# Patient Record
Sex: Female | Born: 1950
Health system: Southern US, Community
[De-identification: ages and names within clinical notes are randomized; demographics above are authoritative.]

## PROBLEM LIST (undated history)

## (undated) ENCOUNTER — Emergency Department: Discharge status: Absent without leave | Payer: Medicare Other

## (undated) DIAGNOSIS — Z9889 Other specified postprocedural states: Secondary | ICD-10-CM

## (undated) DIAGNOSIS — E785 Hyperlipidemia, unspecified: Secondary | ICD-10-CM

## (undated) DIAGNOSIS — E079 Disorder of thyroid, unspecified: Secondary | ICD-10-CM

## (undated) DIAGNOSIS — M48061 Spinal stenosis, lumbar region without neurogenic claudication: Secondary | ICD-10-CM

## (undated) DIAGNOSIS — M1990 Unspecified osteoarthritis, unspecified site: Secondary | ICD-10-CM

## (undated) DIAGNOSIS — E119 Type 2 diabetes mellitus without complications: Secondary | ICD-10-CM

## (undated) DIAGNOSIS — F419 Anxiety disorder, unspecified: Secondary | ICD-10-CM

## (undated) DIAGNOSIS — R7303 Prediabetes: Secondary | ICD-10-CM

## (undated) DIAGNOSIS — K219 Gastro-esophageal reflux disease without esophagitis: Secondary | ICD-10-CM

## (undated) DIAGNOSIS — G4730 Sleep apnea, unspecified: Secondary | ICD-10-CM

## (undated) DIAGNOSIS — I10 Essential (primary) hypertension: Secondary | ICD-10-CM

## (undated) DIAGNOSIS — E559 Vitamin D deficiency, unspecified: Secondary | ICD-10-CM

## (undated) DIAGNOSIS — C801 Malignant (primary) neoplasm, unspecified: Secondary | ICD-10-CM

## (undated) DIAGNOSIS — M5431 Sciatica, right side: Secondary | ICD-10-CM

## (undated) DIAGNOSIS — T753XXA Motion sickness, initial encounter: Secondary | ICD-10-CM

## (undated) DIAGNOSIS — E039 Hypothyroidism, unspecified: Secondary | ICD-10-CM

## (undated) DIAGNOSIS — R112 Nausea with vomiting, unspecified: Secondary | ICD-10-CM

## (undated) HISTORY — PX: SPINE SURGERY: SHX786

## (undated) HISTORY — DX: Hyperlipidemia, unspecified: E78.5

## (undated) HISTORY — DX: Anxiety disorder, unspecified: F41.9

## (undated) HISTORY — DX: Essential (primary) hypertension: I10

## (undated) HISTORY — DX: Vitamin D deficiency, unspecified: E55.9

## (undated) HISTORY — DX: Disorder of thyroid, unspecified: E07.9

## (undated) HISTORY — DX: Gastro-esophageal reflux disease without esophagitis: K21.9

## (undated) HISTORY — DX: Type 2 diabetes mellitus without complications: E11.9

---

## 1979-01-12 HISTORY — PX: TUBAL LIGATION: SHX77

## 1999-08-06 ENCOUNTER — Encounter: Admission: RE | Admit: 1999-08-06 | Discharge: 1999-08-06 | Payer: Self-pay | Admitting: Family Medicine

## 1999-08-06 ENCOUNTER — Encounter: Payer: Self-pay | Admitting: Family Medicine

## 2000-08-29 ENCOUNTER — Encounter: Admission: RE | Admit: 2000-08-29 | Discharge: 2000-08-29 | Payer: Self-pay | Admitting: Family Medicine

## 2000-08-29 ENCOUNTER — Encounter: Payer: Self-pay | Admitting: Family Medicine

## 2001-01-20 ENCOUNTER — Encounter: Admission: RE | Admit: 2001-01-20 | Discharge: 2001-01-20 | Payer: Self-pay | Admitting: Family Medicine

## 2001-01-20 ENCOUNTER — Encounter: Payer: Self-pay | Admitting: Family Medicine

## 2001-02-09 ENCOUNTER — Other Ambulatory Visit: Admission: RE | Admit: 2001-02-09 | Discharge: 2001-02-09 | Payer: Self-pay | Admitting: Family Medicine

## 2001-08-30 ENCOUNTER — Encounter: Payer: Self-pay | Admitting: Family Medicine

## 2001-08-30 ENCOUNTER — Encounter: Admission: RE | Admit: 2001-08-30 | Discharge: 2001-08-30 | Payer: Self-pay | Admitting: Family Medicine

## 2002-02-21 ENCOUNTER — Other Ambulatory Visit: Admission: RE | Admit: 2002-02-21 | Discharge: 2002-02-21 | Payer: Self-pay | Admitting: Family Medicine

## 2003-01-12 HISTORY — PX: SPINE SURGERY: SHX786

## 2003-02-12 ENCOUNTER — Other Ambulatory Visit: Admission: RE | Admit: 2003-02-12 | Discharge: 2003-02-12 | Payer: Self-pay | Admitting: Family Medicine

## 2003-06-24 ENCOUNTER — Ambulatory Visit (HOSPITAL_COMMUNITY): Admission: RE | Admit: 2003-06-24 | Discharge: 2003-06-25 | Payer: Self-pay | Admitting: Neurosurgery

## 2003-08-01 ENCOUNTER — Encounter: Admission: RE | Admit: 2003-08-01 | Discharge: 2003-08-01 | Payer: Self-pay | Admitting: Neurosurgery

## 2003-10-22 ENCOUNTER — Encounter: Admission: RE | Admit: 2003-10-22 | Discharge: 2003-10-22 | Payer: Self-pay | Admitting: Neurosurgery

## 2003-10-28 ENCOUNTER — Ambulatory Visit: Payer: Self-pay | Admitting: Neurosurgery

## 2004-02-04 ENCOUNTER — Encounter: Admission: RE | Admit: 2004-02-04 | Discharge: 2004-02-04 | Payer: Self-pay | Admitting: Neurosurgery

## 2004-04-28 ENCOUNTER — Encounter: Admission: RE | Admit: 2004-04-28 | Discharge: 2004-04-28 | Payer: Self-pay | Admitting: Neurosurgery

## 2004-05-05 ENCOUNTER — Ambulatory Visit: Payer: Self-pay | Admitting: Family Medicine

## 2004-08-11 ENCOUNTER — Encounter: Admission: RE | Admit: 2004-08-11 | Discharge: 2004-08-11 | Payer: Self-pay | Admitting: Neurosurgery

## 2008-02-15 ENCOUNTER — Inpatient Hospital Stay: Payer: Self-pay | Admitting: Internal Medicine

## 2012-09-05 ENCOUNTER — Ambulatory Visit: Payer: Self-pay | Admitting: Family Medicine

## 2013-01-09 LAB — BASIC METABOLIC PANEL
BUN: 14 mg/dL (ref 4–21)
CREATININE: 0.9 mg/dL (ref 0.5–1.1)
Glucose: 101 mg/dL
POTASSIUM: 4.4 mmol/L (ref 3.4–5.3)
Sodium: 142 mmol/L (ref 137–147)

## 2013-01-10 LAB — HM PAP SMEAR: HM PAP: NORMAL

## 2013-09-06 ENCOUNTER — Ambulatory Visit: Payer: Self-pay | Admitting: General Practice

## 2013-09-11 LAB — HM MAMMOGRAPHY

## 2014-01-01 ENCOUNTER — Encounter: Payer: Self-pay | Admitting: Nurse Practitioner

## 2014-01-01 ENCOUNTER — Ambulatory Visit (INDEPENDENT_AMBULATORY_CARE_PROVIDER_SITE_OTHER): Payer: No Typology Code available for payment source | Admitting: Nurse Practitioner

## 2014-01-01 ENCOUNTER — Encounter (INDEPENDENT_AMBULATORY_CARE_PROVIDER_SITE_OTHER): Payer: Self-pay

## 2014-01-01 VITALS — BP 146/78 | HR 67 | Temp 98.0°F | Resp 12 | Ht 68.0 in | Wt 189.8 lb

## 2014-01-01 DIAGNOSIS — Z7689 Persons encountering health services in other specified circumstances: Secondary | ICD-10-CM

## 2014-01-01 DIAGNOSIS — Z7189 Other specified counseling: Secondary | ICD-10-CM

## 2014-01-01 DIAGNOSIS — R233 Spontaneous ecchymoses: Secondary | ICD-10-CM | POA: Insufficient documentation

## 2014-01-01 DIAGNOSIS — E785 Hyperlipidemia, unspecified: Secondary | ICD-10-CM

## 2014-01-01 DIAGNOSIS — Z0000 Encounter for general adult medical examination without abnormal findings: Secondary | ICD-10-CM | POA: Insufficient documentation

## 2014-01-01 DIAGNOSIS — F411 Generalized anxiety disorder: Secondary | ICD-10-CM

## 2014-01-01 MED ORDER — ALPRAZOLAM 0.25 MG PO TABS: 0.2500 mg | ORAL_TABLET | Freq: Two times a day (BID) | ORAL | Status: DC | PRN

## 2014-01-01 MED ORDER — PRAVASTATIN SODIUM 40 MG PO TABS: 40.0000 mg | ORAL_TABLET | Freq: Every day | ORAL | Status: DC

## 2014-01-01 NOTE — Patient Instructions (Addendum)
Happy Holidays!   Welcome to [facility]!

## 2014-01-01 NOTE — Progress Notes (Signed)
Pre visit review using our clinic review tool, if applicable. No additional management support is needed unless otherwise documented below in the visit note. 

## 2014-01-01 NOTE — Progress Notes (Signed)
Subjective:    Patient ID: Darlene Mcdowell, female    DOB: Jun 05, 1950, 63 y.o.   MRN: [id number]  HPI  Darlene Mcdowell is a 63 yo female here to establish care.   1) Health Maintenance-   Diet- Cooks at home, eats fast food, does not like raisins, cutting down on Beef  Exercise- Up and down stairs for work   Immunizations- last Dec. Pneumonia, tdap dec. 2014, flu 10/15, shingles vaccination in past also.   Mammogram- 09/15 Negative, dense breast tissue   Pap- 2014, normal  Bone Density- 3-4 years ago, normal  Colonoscopy- Never had, refuses    Suggested IFOBT  Eye Exam- Every year, Jan. 2016 next appointment  Dental Exam- Every 6 months  2) Chronic Problems-  Hyperlipidemia- Pravastatin 40 mg tablets   HTN- Not checking at home, Amlodipine 5 mg   Hypothyroid - Fall 2015, in normal range TSH  Urology- 3 years ago, stress incontinence    Gave some kind of pill and it wiped her out she reports.   3) Acute Problems-  Petechiae below knee both legs, denies wearing compression hose, knee high socks or other garments. Pt denies itching or other sensations. Onset greater than 1 year ago.   Review of Systems  Constitutional: Positive for fatigue. Negative for fever, chills and diaphoresis.  Eyes: Negative for visual disturbance.  Respiratory: Negative for cough, chest tightness, shortness of breath and wheezing.   Cardiovascular: Negative for chest pain, palpitations and leg swelling.  Gastrointestinal: Negative for nausea, vomiting and diarrhea.  Genitourinary:       Vaginal dryness and stress leakage  Musculoskeletal: Negative for myalgias, joint swelling and arthralgias.  Skin: Positive for rash.       Both legs below knees  Neurological: Negative for dizziness, numbness and headaches.  Psychiatric/Behavioral: Negative for suicidal ideas.       Anxiety, Denies depression   Past Medical History  Diagnosis Date  . Hypertension   . Hyperlipidemia   . Thyroid disease     under  active thyroid problems    History   Social History  . Marital Status: Married    Spouse Name: N/A    Number of Children: N/A  . Years of Education: N/A   Occupational History  . Not on file.   Social History Main Topics  . Smoking status: Former Smoker    Quit date: 01/12/2003  . Smokeless tobacco: Not on file  . Alcohol Use: 0.0 oz/week    0 Not specified per week     Comment: Occasional glass of wine  . Drug Use: No  . Sexual Activity:    Partners: Male     Comment: With husband   Other Topics Concern  . 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 Tubal ligation  1981  . Spine surgery  01/12/2003    cervical c-7    Family History  Problem Relation Age of Onset  . Stroke Mother   . Hypertension Mother   . Hyperlipidemia Father   . Heart disease Father   . Hypertension Father   .  Cancer Maternal Grandmother   . Heart disease Maternal Grandfather   . Heart disease Paternal Grandmother     No Known Allergies  No current outpatient prescriptions on file prior to visit.   No current facility-administered medications on file prior to visit.       Objective:   Physical Exam  Constitutional: She is oriented to person, place, and time. She appears well-developed and well-nourished. No distress.  HENT:  Head: Normocephalic and atraumatic.  Right Ear: External ear normal.  Left Ear: External ear normal.  Eyes: Conjunctivae and EOM are normal. Pupils are equal, round, and reactive to light.  Neck: Normal range of motion. Neck supple. No thyromegaly present.  Cardiovascular: Normal rate and regular rhythm.   Pulmonary/Chest: Effort normal and breath sounds  normal.  Abdominal: Soft. Bowel sounds are normal.  Neurological: She is oriented to person, place, and time. She exhibits normal muscle tone. Coordination normal.  Skin: Skin is warm and dry. Rash noted. She is not diaphoretic.  Petechiae below knees bilaterally.   Psychiatric: She has a normal mood and affect. Her behavior is normal. Judgment and thought content normal.    BP 146/78 mmHg  Pulse 67  Temp(Src) 98 F (36.7 C) (Oral)  Resp 12  Ht 5\' 8"  (1.727 m)  Wt 189 lb 12.8 oz (86.093 kg)  BMI 28.87 kg/m2  SpO2 97%  LMP 01/11/1994     Assessment & Plan:  Face to face time was 45 minutes and >50% spent counseling on above mentioned items.

## 2014-01-01 NOTE — Assessment & Plan Note (Signed)
New order for Xanax 0.25 mg twice daily for this month. Will fax to pharmacy and get controlled substance contract signed by June 2016.

## 2014-01-01 NOTE — Assessment & Plan Note (Signed)
Refill of Pravastatin 40 mg daily. Stable. Will obtain labs from former places.

## 2014-01-01 NOTE — Assessment & Plan Note (Signed)
Patient has unexplained petechiae below knees. No other symptoms. Obtain through [facility]: Cbc w/ diff w/ plt, AST and ALT, Sed rate, and PTT/PT/INR. FU in June if normal labs.

## 2014-01-09 LAB — PROTIME-INR: Protime: 10.7 seconds (ref 10.0–13.8)

## 2014-01-09 LAB — CBC AND DIFFERENTIAL
HCT: 41 % (ref 36–46)
Hemoglobin: 13.3 g/dL (ref 12.0–16.0)
NEUTROS ABS: 3 /uL
Platelets: 210 10*3/uL (ref 150–399)
WBC: 6 10^3/mL

## 2014-01-09 LAB — POCT INR: INR: 1 (ref 0.9–1.1)

## 2014-01-09 LAB — HEPATIC FUNCTION PANEL
ALT: 36 U/L — AB (ref 7–35)
AST: 23 U/L (ref 13–35)

## 2014-01-18 ENCOUNTER — Ambulatory Visit: Payer: Self-pay | Admitting: Nurse Practitioner

## 2014-02-13 ENCOUNTER — Other Ambulatory Visit: Payer: Self-pay | Admitting: Nurse Practitioner

## 2014-02-13 ENCOUNTER — Encounter: Payer: Self-pay | Admitting: Nurse Practitioner

## 2014-02-13 MED ORDER — ALPRAZOLAM 0.25 MG PO TABS: 0.2500 mg | ORAL_TABLET | Freq: Two times a day (BID) | ORAL | Status: DC | PRN

## 2014-03-06 ENCOUNTER — Other Ambulatory Visit: Payer: Self-pay | Admitting: Nurse Practitioner

## 2014-03-07 ENCOUNTER — Other Ambulatory Visit: Payer: Self-pay | Admitting: Nurse Practitioner

## 2014-03-07 MED ORDER — ALPRAZOLAM 0.25 MG PO TABS: 0.2500 mg | ORAL_TABLET | Freq: Two times a day (BID) | ORAL | Status: DC | PRN

## 2014-04-09 ENCOUNTER — Telehealth: Payer: Self-pay | Admitting: Nurse Practitioner

## 2014-04-11 MED ORDER — ALPRAZOLAM 0.25 MG PO TABS: 0.2500 mg | ORAL_TABLET | Freq: Two times a day (BID) | ORAL | Status: DC | PRN

## 2014-04-11 NOTE — Telephone Encounter (Signed)
Called patient and left message to pick up Rx.

## 2014-05-07 ENCOUNTER — Other Ambulatory Visit: Payer: Self-pay | Admitting: Nurse Practitioner

## 2014-05-07 NOTE — Telephone Encounter (Signed)
Last OV 12.22.15, last refill 3.31.16.  Please advise refill

## 2014-06-07 ENCOUNTER — Other Ambulatory Visit: Payer: Self-pay | Admitting: Nurse Practitioner

## 2014-06-08 NOTE — Telephone Encounter (Signed)
Okay to refill Xanax? Pt last seen on 01/01/14 & next appt on 07/09/14. Please advise

## 2014-06-11 NOTE — Telephone Encounter (Signed)
Rx faxed by [facility]

## 2014-07-03 ENCOUNTER — Ambulatory Visit: Payer: No Typology Code available for payment source | Admitting: Nurse Practitioner

## 2014-07-04 ENCOUNTER — Encounter: Payer: Self-pay | Admitting: *Deleted

## 2014-07-08 ENCOUNTER — Other Ambulatory Visit: Payer: Self-pay

## 2014-07-09 ENCOUNTER — Encounter: Payer: Self-pay | Admitting: Nurse Practitioner

## 2014-07-09 ENCOUNTER — Other Ambulatory Visit: Payer: Self-pay | Admitting: Nurse Practitioner

## 2014-07-09 ENCOUNTER — Ambulatory Visit (INDEPENDENT_AMBULATORY_CARE_PROVIDER_SITE_OTHER): Payer: Medicare Other | Admitting: Nurse Practitioner

## 2014-07-09 VITALS — BP 122/84 | HR 66 | Temp 98.4°F | Resp 16 | Ht 68.0 in | Wt 196.0 lb

## 2014-07-09 DIAGNOSIS — E785 Hyperlipidemia, unspecified: Secondary | ICD-10-CM | POA: Diagnosis not present

## 2014-07-09 DIAGNOSIS — R233 Spontaneous ecchymoses: Secondary | ICD-10-CM

## 2014-07-09 DIAGNOSIS — F411 Generalized anxiety disorder: Secondary | ICD-10-CM | POA: Diagnosis not present

## 2014-07-09 DIAGNOSIS — E039 Hypothyroidism, unspecified: Secondary | ICD-10-CM | POA: Diagnosis not present

## 2014-07-09 DIAGNOSIS — N3946 Mixed incontinence: Secondary | ICD-10-CM

## 2014-07-09 LAB — LIPID PANEL
CHOLESTEROL: 134 mg/dL (ref 0–200)
HDL: 30.2 mg/dL — AB (ref >39.00)
LDL Cholesterol: 85 mg/dL (ref 0–99)
NonHDL: 103.8
Total CHOL/HDL Ratio: 4
Triglycerides: 92 mg/dL (ref 0.0–149.0)
VLDL: 18.4 mg/dL (ref 0.0–40.0)

## 2014-07-09 LAB — TSH: TSH: 1.43 u[IU]/mL (ref 0.35–4.50)

## 2014-07-09 MED ORDER — OMEPRAZOLE 20 MG PO CPDR: 20.0000 mg | DELAYED_RELEASE_CAPSULE | Freq: Every day | ORAL | Status: DC

## 2014-07-09 MED ORDER — LEVOTHYROXINE SODIUM 112 MCG PO TABS: 112.0000 ug | ORAL_TABLET | Freq: Every day | ORAL | Status: DC

## 2014-07-09 MED ORDER — AMLODIPINE BESYLATE 5 MG PO TABS: 5.0000 mg | ORAL_TABLET | Freq: Every day | ORAL | Status: DC

## 2014-07-09 MED ORDER — ALPRAZOLAM 0.25 MG PO TABS: 0.2500 mg | ORAL_TABLET | Freq: Two times a day (BID) | ORAL | Status: DC | PRN

## 2014-07-09 MED ORDER — FENOFIBRATE MICRONIZED 134 MG PO CAPS: 134.0000 mg | ORAL_CAPSULE | Freq: Every day | ORAL | Status: DC

## 2014-07-09 MED ORDER — PRAVASTATIN SODIUM 40 MG PO TABS: 40.0000 mg | ORAL_TABLET | Freq: Every day | ORAL | Status: DC

## 2014-07-09 NOTE — Progress Notes (Signed)
   Subjective:    Patient ID: Darlene Mcdowell, female    DOB: 03-Nov-1950, 64 y.o.   MRN: [id number]  HPI  [name] is a 64 yo female with a CC of needing med refills   1) Petechiae on legs and wearing compression socks per Dermatology   2) Leg/feet cramps- compression hose helpful, week without having one   3) Bladder leaking- oxybutinin 2013 tried it, urge incontinence   Wears pad for leaks with urge incontinence- feels she can look at the bathroom and starts to go  Carbonated beverages- down to 1 a day   Cut down on sugary drinks  Diet- Nutrisystem lost 10-15 lbs, aunt was sick suddenly and put it all back on.   Exercise- Wants to start Tai Chi   Xanax- half in morning and whole at night   Review of Systems  Constitutional: Negative for fever, chills, diaphoresis, fatigue and unexpected weight change.  HENT: Negative for tinnitus and trouble swallowing.   Eyes: Negative for visual disturbance.  Respiratory: Negative for cough, chest tightness, shortness of breath and wheezing.   Cardiovascular: Negative for chest pain, palpitations and leg swelling.  Gastrointestinal: Negative for nausea, vomiting, abdominal pain, diarrhea, constipation and blood in stool.  Endocrine: Negative for polydipsia, polyphagia and polyuria.  Genitourinary: Positive for urgency and frequency. Negative for dysuria, hematuria, vaginal discharge and vaginal pain.  Musculoskeletal: Positive for myalgias. Negative for back pain, arthralgias and gait problem.  Skin: Negative for color change and rash.  Neurological: Negative for dizziness, weakness, numbness and headaches.  Hematological: Does not bruise/bleed easily.  Psychiatric/Behavioral: Negative for suicidal ideas and sleep disturbance. The patient is not nervous/anxious.       Objective:   Physical Exam  Constitutional: She is oriented to person, place, and time. She appears well-developed and well-nourished. No distress.  BP 122/84 mmHg  Pulse  66  Temp(Src) 98.4 F (36.9 C)  Resp 16  Ht $R'5\' 8"'og$  (1.727 m)  Wt 196 lb (88.905 kg)  BMI 29.81 kg/m2  SpO2 98%  LMP 01/11/1994   HENT:  Head: Normocephalic and atraumatic.  Right Ear: External ear normal.  Left Ear: External ear normal.  Cardiovascular: Normal rate and regular rhythm.   Pulmonary/Chest: Effort normal and breath sounds normal. No respiratory distress. She has no wheezes. She has no rales. She exhibits no tenderness.  Neurological: She is alert and oriented to person, place, and time. No cranial nerve deficit. She exhibits normal muscle tone. Coordination normal.  Skin: Skin is warm and dry. No rash noted. She is not diaphoretic.  Psychiatric: She has a normal mood and affect. Her behavior is normal. Judgment and thought content normal.      Assessment & Plan:

## 2014-07-09 NOTE — Progress Notes (Signed)
Pre visit review using our clinic review tool, if applicable. No additional management support is needed unless otherwise documented below in the visit note. 

## 2014-07-09 NOTE — Patient Instructions (Addendum)
Lots of fiber, metamucil as needed to supplement  Labs- cholesterol, Thyroid panel   Kegal exercises, Weight loss, and fiber!   Follow up in 3 months.   Visit the lab before leaving today.

## 2014-07-10 ENCOUNTER — Other Ambulatory Visit: Payer: Self-pay | Admitting: Nurse Practitioner

## 2014-07-10 MED ORDER — LEVOTHYROXINE SODIUM 112 MCG PO TABS: 112.0000 ug | ORAL_TABLET | Freq: Every day | ORAL | Status: DC

## 2014-07-16 ENCOUNTER — Telehealth: Payer: Self-pay

## 2014-07-16 NOTE — Telephone Encounter (Signed)
LMTCB regarding scheduling mammogram

## 2014-07-21 ENCOUNTER — Encounter: Payer: Self-pay | Admitting: Nurse Practitioner

## 2014-07-21 DIAGNOSIS — R32 Unspecified urinary incontinence: Secondary | ICD-10-CM | POA: Insufficient documentation

## 2014-07-21 NOTE — Assessment & Plan Note (Signed)
Saw dermatology, wearing compression hose.

## 2014-07-21 NOTE — Assessment & Plan Note (Signed)
Discussed options for treatment of mixed incontinence. She saw a urologist in the past and tried oxybutinin without relief. Gave handout with kegal exercise instructions and how many times to do them daily. Also, asked her to increase fiber, decrease caffeine, and work on weight loss. Pt is motivated. FU in 3 months.

## 2014-07-21 NOTE — Assessment & Plan Note (Signed)
Xanax script printed for pt. She is controlled on this for her anxiety. Takes 1.5 tablets per day. Fu in 3 months. Compliant on [place].

## 2014-07-21 NOTE — Assessment & Plan Note (Signed)
Obtaining TSH today.  

## 2014-07-21 NOTE — Assessment & Plan Note (Signed)
Obtain lipid panel today.  ?

## 2014-09-20 ENCOUNTER — Encounter: Payer: Self-pay | Admitting: Nurse Practitioner

## 2014-09-21 ENCOUNTER — Other Ambulatory Visit: Payer: Self-pay | Admitting: Nurse Practitioner

## 2014-09-23 ENCOUNTER — Other Ambulatory Visit: Payer: Self-pay | Admitting: Nurse Practitioner

## 2014-09-23 MED ORDER — ALPRAZOLAM 0.25 MG PO TABS: 0.2500 mg | ORAL_TABLET | Freq: Two times a day (BID) | ORAL | Status: DC | PRN

## 2014-09-24 NOTE — Telephone Encounter (Signed)
This has already been filled.

## 2014-10-10 ENCOUNTER — Ambulatory Visit: Payer: Medicare Other | Admitting: Nurse Practitioner

## 2014-10-11 ENCOUNTER — Encounter: Payer: Self-pay | Admitting: Nurse Practitioner

## 2014-10-11 ENCOUNTER — Ambulatory Visit (INDEPENDENT_AMBULATORY_CARE_PROVIDER_SITE_OTHER): Payer: Medicare Other | Admitting: Nurse Practitioner

## 2014-10-11 VITALS — BP 132/90 | HR 75 | Temp 98.1°F | Resp 14 | Ht 68.0 in | Wt 199.0 lb

## 2014-10-11 DIAGNOSIS — E669 Obesity, unspecified: Secondary | ICD-10-CM | POA: Diagnosis not present

## 2014-10-11 DIAGNOSIS — E663 Overweight: Secondary | ICD-10-CM | POA: Insufficient documentation

## 2014-10-11 DIAGNOSIS — F411 Generalized anxiety disorder: Secondary | ICD-10-CM | POA: Diagnosis not present

## 2014-10-11 DIAGNOSIS — Z23 Encounter for immunization: Secondary | ICD-10-CM

## 2014-10-11 DIAGNOSIS — M79622 Pain in left upper arm: Secondary | ICD-10-CM | POA: Diagnosis not present

## 2014-10-11 NOTE — Assessment & Plan Note (Signed)
BMI 30. Pt interested in losing weight. Motivated, asked her to try Atkins, low carb, or Nutrisystem again and get some exercise (goal 30 min x 3 days a week). FU in 1 month.

## 2014-10-11 NOTE — Assessment & Plan Note (Signed)
Left upper arm pain x 2-3 months. Does not extend to fingers, feels aching, C6-7 was found to be problematic, unsure if she had 6-7 or 7-T1 fused. After negative exam except for tight trapezius, discussed using heat, stretching, and NSAIDs for care. Will follow

## 2014-10-11 NOTE — Patient Instructions (Addendum)
Try diet and exercise for 1 month.   Follow up next month.   For arm warmth, ibuprofen, and stretching

## 2014-10-11 NOTE — Assessment & Plan Note (Signed)
Stable currently. Patient denies need for refills today. Family surgeries are taking a lot of her attention.

## 2014-10-11 NOTE — Progress Notes (Signed)
Pre visit review using our clinic review tool, if applicable. No additional management support is needed unless otherwise documented below in the visit note. 

## 2014-10-11 NOTE — Progress Notes (Signed)
Patient ID: Darlene Mcdowell, female    DOB: November 08, 1950  Age: 64 y.o. MRN: [id number]  CC: Follow-up   HPI TOMORROW DEHAAS presents for follow up of anxiety.   1) Anxiety- okay, home concerns with multiple surgeries of family members. Xanax still helpful   2) Left deltoid pain x 2-3 months. Last night was worse, denies sleeping on that arm, sleeps with pillow under it, has had h/o ACDF, denies problems with gripping. Still having neck pain   3) Diet- tried nutrisystem  Exercise- Walking intermittently and steps, will join husband at walking track  Darlene Mcdowell has a past medical history of Hypertension; Hyperlipidemia; and Thyroid disease.   She has past surgical history that includes Tubal ligation (5397) and Spine surgery (01/12/2003).   Her family history includes Cancer in her maternal grandmother; Heart disease in her father, maternal grandfather, and paternal grandmother; Hyperlipidemia in her father; Hypertension in her father and mother; Stroke in her mother.She reports that she quit smoking about 11 years ago. She does not have any smokeless tobacco history on file. She reports that she drinks alcohol. She reports that she does not use illicit drugs.  Outpatient Prescriptions Prior to Visit  Medication Sig Dispense Refill  . ALPRAZolam (XANAX) 0.25 MG tablet Take 1 tablet (0.25 mg total) by mouth 2 (two) times daily as needed. for anxiety 60 tablet 2  . amLODipine (NORVASC) 5 MG tablet Take 1 tablet (5 mg total) by mouth at bedtime. 30 tablet 5  . fenofibrate micronized (LOFIBRA) 134 MG capsule Take 1 capsule (134 mg total) by mouth at bedtime. 30 capsule 5  . levothyroxine (SYNTHROID, LEVOTHROID) 112 MCG tablet Take 1 tablet (112 mcg total) by mouth daily before breakfast. 90 tablet 3  . omeprazole (PRILOSEC) 20 MG capsule Take 1 capsule (20 mg total) by mouth daily. Swallow whole. Do not crush. 30 capsule 5  . pravastatin (PRAVACHOL) 40 MG tablet Take 1 tablet (40 mg total) by mouth  at bedtime. 30 tablet 5   No facility-administered medications prior to visit.    ROS Review of Systems  Constitutional: Negative for fever, chills, diaphoresis and fatigue.  Respiratory: Negative for chest tightness, shortness of breath and wheezing.   Cardiovascular: Negative for chest pain, palpitations and leg swelling.  Gastrointestinal: Negative for nausea, vomiting and diarrhea.  Musculoskeletal: Positive for myalgias.  Skin: Negative for rash.  Neurological: Negative for dizziness, weakness, numbness and headaches.  Psychiatric/Behavioral: The patient is nervous/anxious.    Objective:  BP 132/90 mmHg  Pulse 75  Temp(Src) 98.1 F (36.7 C)  Resp 14  Ht 5\' 8"  (1.727 m)  Wt 199 lb (90.266 kg)  BMI 30.26 kg/m2  SpO2 96%  LMP 01/11/1994  Physical Exam  Constitutional: She is oriented to person, place, and time. She appears well-developed and well-nourished. No distress.  HENT:  Head: Normocephalic and atraumatic.  Right Ear: External ear normal.  Left Ear: External ear normal.  Cardiovascular: Normal rate, regular rhythm and normal heart sounds.   Pulmonary/Chest: Effort normal and breath sounds normal. No respiratory distress. She has no wheezes. She has no rales. She exhibits no tenderness.  Musculoskeletal: Normal range of motion. She exhibits tenderness. She exhibits no edema.  Tender left side trapezius  Neurological: She is alert and oriented to person, place, and time. No cranial nerve deficit. She exhibits normal muscle tone. Coordination normal.  Skin: Skin is warm and dry. No rash noted. She is not diaphoretic.  Psychiatric: She has a normal  mood and affect. Her behavior is normal. Judgment and thought content normal.   Assessment & Plan:   L[place]ine was seen today for follow-up.  Diagnoses and all orders for this visit:  Generalized anxiety disorder  Encounter for immunization  Obese  Left upper arm pain  Other orders -     Flu Vaccine QUAD 36+ mos  IM  I am having Ms. Joya Gaskins maintain her amLODipine, fenofibrate micronized, omeprazole, pravastatin, levothyroxine, and ALPRAZolam.  No orders of the defined types were placed in this encounter.     Follow-up: Return in about 4 weeks (around 11/08/2014) for weight loss.

## 2014-10-17 ENCOUNTER — Encounter: Payer: Self-pay | Admitting: Nurse Practitioner

## 2014-10-18 ENCOUNTER — Other Ambulatory Visit: Payer: Self-pay | Admitting: Nurse Practitioner

## 2014-10-18 MED ORDER — PREDNISONE 10 MG PO TABS: ORAL_TABLET | ORAL | Status: DC

## 2014-11-04 ENCOUNTER — Encounter: Payer: Self-pay | Admitting: Nurse Practitioner

## 2014-11-05 ENCOUNTER — Ambulatory Visit (INDEPENDENT_AMBULATORY_CARE_PROVIDER_SITE_OTHER): Payer: Medicare Other | Admitting: Nurse Practitioner

## 2014-11-05 ENCOUNTER — Encounter: Payer: Self-pay | Admitting: Nurse Practitioner

## 2014-11-05 VITALS — BP 122/80 | HR 76 | Temp 97.7°F | Resp 14 | Ht 68.0 in | Wt 192.6 lb

## 2014-11-05 DIAGNOSIS — E669 Obesity, unspecified: Secondary | ICD-10-CM | POA: Diagnosis not present

## 2014-11-05 DIAGNOSIS — R21 Rash and other nonspecific skin eruption: Secondary | ICD-10-CM | POA: Diagnosis not present

## 2014-11-05 MED ORDER — PHENTERMINE HCL 37.5 MG PO TABS: 37.5000 mg | ORAL_TABLET | Freq: Every day | ORAL | Status: DC

## 2014-11-05 MED ORDER — TRIAMCINOLONE ACETONIDE 0.5 % EX OINT: 1.0000 "application " | TOPICAL_OINTMENT | Freq: Two times a day (BID) | CUTANEOUS | Status: DC

## 2014-11-05 NOTE — Patient Instructions (Signed)
Please follow up in 1 month.   Most people take 1/2 tablet in the morning,  The second half by 2 PM to avoid insomnia. This medication is only for 3 months of use.   Cream twice daily on hands.

## 2014-11-05 NOTE — Assessment & Plan Note (Signed)
Nonspecific skin eruption of unknown etiology. Discussed findings with Dr. Caryl Bis. There are no systemic findings we will treat with Kenalog 0.5% cream twice daily for up to 2 weeks. Asked her to let me know if there are any changes to his symptoms or new symptoms arise.

## 2014-11-05 NOTE — Progress Notes (Signed)
Pre visit review using our clinic review tool, if applicable. No additional management support is needed unless otherwise documented below in the visit note. 

## 2014-11-05 NOTE — Assessment & Plan Note (Signed)
Pt has been successful with minor diet changes over one month. She would like to try phentermine for 1 month. She will follow-up in one month for blood pressure and pulse check. Asked her to continue with diet changes. Increase exercise to 3 times a week for at least 30 minutes.

## 2014-11-05 NOTE — Progress Notes (Signed)
Patient ID: Darlene Mcdowell, female    DOB: 04/26/1950  Age: 64 y.o. MRN: [id number]  CC: Follow-up   HPI Darlene Mcdowell presents for follow up of weight loss and CC of knots that are sore and itchy.   1) Down 7 lbs from last time. Denies cardiac history. Pt started Atkins shakes in the morning and eating healthy fruits and veggies. Patient still has increased appetite and feels that phentermine would be helpful for her at this time. She has not tried any treatments to date.  2) Bumps mildly painful, ended on Sunday and started Monday  Itchy, burning. Bilateral hands starts from tips of thumbs and follows hand up to the tip of the index finger with papules of varying degrees. Patient denies discharge or weeping. Benadryl- not helpful   History Darlene Mcdowell has a past medical history of Hypertension; Hyperlipidemia; and Thyroid disease.   She has past surgical history that includes Tubal ligation (0630) and Spine surgery (01/12/2003).   Her family history includes Cancer in her maternal grandmother; Heart disease in her father, maternal grandfather, and paternal grandmother; Hyperlipidemia in her father; Hypertension in her father and mother; Stroke in her mother.She reports that she quit smoking about 11 years ago. She does not have any smokeless tobacco history on file. She reports that she drinks alcohol. She reports that she does not use illicit drugs.  Outpatient Prescriptions Prior to Visit  Medication Sig Dispense Refill  . ALPRAZolam (XANAX) 0.25 MG tablet Take 1 tablet (0.25 mg total) by mouth 2 (two) times daily as needed. for anxiety 60 tablet 2  . amLODipine (NORVASC) 5 MG tablet Take 1 tablet (5 mg total) by mouth at bedtime. 30 tablet 5  . fenofibrate micronized (LOFIBRA) 134 MG capsule Take 1 capsule (134 mg total) by mouth at bedtime. 30 capsule 5  . levothyroxine (SYNTHROID, LEVOTHROID) 112 MCG tablet Take 1 tablet (112 mcg total) by mouth daily before breakfast. 90 tablet 3  .  omeprazole (PRILOSEC) 20 MG capsule Take 1 capsule (20 mg total) by mouth daily. Swallow whole. Do not crush. 30 capsule 5  . pravastatin (PRAVACHOL) 40 MG tablet Take 1 tablet (40 mg total) by mouth at bedtime. 30 tablet 5  . predniSONE (DELTASONE) 10 MG tablet Take 6 tablets by mouth on day 1 then decrease by 1 tablet each day until gone. 21 tablet 0   No facility-administered medications prior to visit.    ROS Review of Systems  Constitutional: Negative for fever, chills, diaphoresis and fatigue.  Respiratory: Negative for chest tightness, shortness of breath and wheezing.   Cardiovascular: Negative for chest pain, palpitations and leg swelling.  Gastrointestinal: Negative for nausea, vomiting and diarrhea.  Skin: Positive for rash.  Neurological: Negative for dizziness, weakness, numbness and headaches.  Psychiatric/Behavioral: The patient is not nervous/anxious.     Objective:  BP 122/80 mmHg  Pulse 76  Temp(Src) 97.7 F (36.5 C)  Resp 14  Ht 5\' 8"  (1.727 m)  Wt 192 lb 9.6 oz (87.363 kg)  BMI 29.29 kg/m2  SpO2 96%  LMP 01/11/1994  Physical Exam  Constitutional: She is oriented to person, place, and time. She appears well-developed and well-nourished. No distress.  HENT:  Head: Normocephalic and atraumatic.  Right Ear: External ear normal.  Left Ear: External ear normal.  Musculoskeletal:       Arms: Neurological: She is alert and oriented to person, place, and time.  Skin: Skin is warm and dry. Rash noted. She is not  diaphoretic.  Red areas indicate where the papules are located they are on the lateral sides of the fingers in her the same bilaterally. There is no other indication of rash or lesion on palms, posterior hand, and wrists.  Psychiatric: She has a normal mood and affect. Her behavior is normal. Judgment and thought content normal.      Assessment & Plan:   Darlene Mcdowell was seen today for follow-up.  Diagnoses and all orders for this visit:  Obese  Rash  and nonspecific skin eruption  Other orders -     triamcinolone ointment (KENALOG) 0.5 %; Apply 1 application topically 2 (two) times daily. -     phentermine (ADIPEX-P) 37.5 MG tablet; Take 1 tablet (37.5 mg total) by mouth daily before breakfast.  I have discontinued Ms. Lung's predniSONE. I am also having her start on triamcinolone ointment and phentermine. Additionally, I am having her maintain her amLODipine, fenofibrate micronized, omeprazole, pravastatin, levothyroxine, and ALPRAZolam.  Meds ordered this encounter  Medications  . triamcinolone ointment (KENALOG) 0.5 %    Sig: Apply 1 application topically 2 (two) times daily.    Dispense:  30 g    Refill:  0    Order Specific Question:  Supervising Provider    Answer:  Deborra Medina L [[id number]]  . phentermine (ADIPEX-P) 37.5 MG tablet    Sig: Take 1 tablet (37.5 mg total) by mouth daily before breakfast.    Dispense:  30 tablet    Refill:  0    Order Specific Question:  Supervising Provider    Answer:  Crecencio Mc [[id number]]     Follow-up: Return in about 4 weeks (around 12/03/2014) for Wt loss.

## 2014-11-08 ENCOUNTER — Ambulatory Visit: Payer: Medicare Other | Admitting: Nurse Practitioner

## 2014-12-03 ENCOUNTER — Ambulatory Visit (INDEPENDENT_AMBULATORY_CARE_PROVIDER_SITE_OTHER): Payer: Medicare Other | Admitting: Nurse Practitioner

## 2014-12-03 ENCOUNTER — Encounter: Payer: Self-pay | Admitting: Nurse Practitioner

## 2014-12-03 VITALS — BP 130/86 | HR 80 | Temp 98.0°F | Resp 20 | Ht 68.0 in | Wt 184.5 lb

## 2014-12-03 DIAGNOSIS — F411 Generalized anxiety disorder: Secondary | ICD-10-CM | POA: Diagnosis not present

## 2014-12-03 DIAGNOSIS — Z23 Encounter for immunization: Secondary | ICD-10-CM | POA: Diagnosis not present

## 2014-12-03 DIAGNOSIS — E669 Obesity, unspecified: Secondary | ICD-10-CM

## 2014-12-03 MED ORDER — PHENTERMINE HCL 37.5 MG PO TABS: 37.5000 mg | ORAL_TABLET | Freq: Every day | ORAL | Status: DC

## 2014-12-03 MED ORDER — ALPRAZOLAM 0.25 MG PO TABS: 0.2500 mg | ORAL_TABLET | Freq: Two times a day (BID) | ORAL | Status: DC | PRN

## 2014-12-03 NOTE — Patient Instructions (Signed)
Follow up in 2 months for weight loss. If you wish cut back to 1/2 tablet once daily or your same regimen, but every other day.

## 2014-12-03 NOTE — Progress Notes (Signed)
Patient ID: Darlene Mcdowell, female    DOB: 1950/10/20  Age: 64 y.o. MRN: [id number]  CC: Follow-up   HPI Darlene Mcdowell presents for follow up of weight loss and CC of immunization need.  1) Prevnar given today (pneumovax prior to 64 given in 2014)  2) Not snacking at night as much any more, less urge to eat, increasingly loose stools recently. 8 lbs lost since last visit approx 1 month ago. Discussed 1-2 lbs a week is slow and steady, this maybe too fast.  1/2 tablet in am and 1/2 afternoon currently. Denies palpitations, anxiety, or trouble sleeping. Positive for increased thirst.  History Darlene Mcdowell has a past medical history of Hypertension; Hyperlipidemia; and Thyroid disease.   She has past surgical history that includes Tubal ligation [id number]) and Spine surgery (01/12/2003).   Her family history includes Cancer in her maternal grandmother; Heart disease in her father, maternal grandfather, and paternal grandmother; Hyperlipidemia in her father; Hypertension in her father and mother; Stroke in her mother.She reports that she quit smoking about 11 years ago. She does not have any smokeless tobacco history on file. She reports that she drinks alcohol. She reports that she does not use illicit drugs.  Outpatient Prescriptions Prior to Visit  Medication Sig Dispense Refill  . amLODipine (NORVASC) 5 MG tablet Take 1 tablet (5 mg total) by mouth at bedtime. 30 tablet 5  . fenofibrate micronized (LOFIBRA) 134 MG capsule Take 1 capsule (134 mg total) by mouth at bedtime. 30 capsule 5  . levothyroxine (SYNTHROID, LEVOTHROID) 112 MCG tablet Take 1 tablet (112 mcg total) by mouth daily before breakfast. 90 tablet 3  . omeprazole (PRILOSEC) 20 MG capsule Take 1 capsule (20 mg total) by mouth daily. Swallow whole. Do not crush. 30 capsule 5  . pravastatin (PRAVACHOL) 40 MG tablet Take 1 tablet (40 mg total) by mouth at bedtime. 30 tablet 5  . triamcinolone ointment (KENALOG) 0.5 % Apply 1 application  topically 2 (two) times daily. 30 g 0  . ALPRAZolam (XANAX) 0.25 MG tablet Take 1 tablet (0.25 mg total) by mouth 2 (two) times daily as needed. for anxiety 60 tablet 2  . phentermine (ADIPEX-P) 37.5 MG tablet Take 1 tablet (37.5 mg total) by mouth daily before breakfast. 30 tablet 0   No facility-administered medications prior to visit.    ROS Review of Systems  Constitutional: Negative for fever, chills, diaphoresis and fatigue.  Respiratory: Negative for chest tightness, shortness of breath and wheezing.   Cardiovascular: Negative for chest pain, palpitations and leg swelling.  Gastrointestinal: Negative for nausea, vomiting, diarrhea and rectal pain.  Skin: Negative for rash.  Neurological: Negative for dizziness, weakness, numbness and headaches.  Psychiatric/Behavioral: The patient is not nervous/anxious.     Objective:  BP 130/86 mmHg  Pulse 80  Temp(Src) 98 F (36.7 C) (Oral)  Resp 20  Ht 5\' 8"  (1.727 m)  Wt 184 lb 8 oz (83.689 kg)  BMI 28.06 kg/m2  SpO2 95%  LMP 01/11/1994  Physical Exam  Constitutional: She is oriented to person, place, and time. She appears well-developed and well-nourished. No distress.  HENT:  Head: Normocephalic and atraumatic.  Right Ear: External ear normal.  Left Ear: External ear normal.  Cardiovascular: Normal rate, regular rhythm and normal heart sounds.  Exam reveals no gallop and no friction rub.   No murmur heard. Pulmonary/Chest: Effort normal and breath sounds normal. No respiratory distress. She has no wheezes. She has no rales. She exhibits no  tenderness.  Neurological: She is alert and oriented to person, place, and time. No cranial nerve deficit. She exhibits normal muscle tone. Coordination normal.  Skin: Skin is warm and dry. No rash noted. She is not diaphoretic.  Psychiatric: She has a normal mood and affect. Her behavior is normal. Judgment and thought content normal.   Assessment & Plan:   Darlene Mcdowell was seen today for  follow-up.  Diagnoses and all orders for this visit:  Need for prophylactic vaccination against Streptococcus pneumoniae (pneumococcus) -     Pneumococcal conjugate vaccine 13-valent IM  Obese  Other orders -     Discontinue: phentermine (ADIPEX-P) 37.5 MG tablet; Take 1 tablet (37.5 mg total) by mouth daily before breakfast. -     ALPRAZolam (XANAX) 0.25 MG tablet; Take 1 tablet (0.25 mg total) by mouth 2 (two) times daily as needed. for anxiety -     phentermine (ADIPEX-P) 37.5 MG tablet; Take 1 tablet (37.5 mg total) by mouth daily before breakfast.   I am having Darlene Mcdowell maintain her amLODipine, fenofibrate micronized, omeprazole, pravastatin, levothyroxine, triamcinolone ointment, ALPRAZolam, and phentermine.  Meds ordered this encounter  Medications  . DISCONTD: phentermine (ADIPEX-P) 37.5 MG tablet    Sig: Take 1 tablet (37.5 mg total) by mouth daily before breakfast.    Dispense:  30 tablet    Refill:  2    Order Specific Question:  Supervising Provider    Answer:  Deborra Medina L [[id number]]  . ALPRAZolam (XANAX) 0.25 MG tablet    Sig: Take 1 tablet (0.25 mg total) by mouth 2 (two) times daily as needed. for anxiety    Dispense:  60 tablet    Refill:  2    Order Specific Question:  Supervising Provider    Answer:  Deborra Medina L [[id number]]  . phentermine (ADIPEX-P) 37.5 MG tablet    Sig: Take 1 tablet (37.5 mg total) by mouth daily before breakfast.    Dispense:  30 tablet    Refill:  1    Order Specific Question:  Supervising Provider    Answer:  Crecencio Mc [[id number]]     Follow-up: Return in about 2 months (around 02/02/2015) for Wt loss FU .

## 2014-12-03 NOTE — Progress Notes (Signed)
Pre visit review using our clinic review tool, if applicable. No additional management support is needed unless otherwise documented below in the visit note. 

## 2014-12-11 ENCOUNTER — Encounter: Payer: Self-pay | Admitting: Nurse Practitioner

## 2014-12-15 ENCOUNTER — Encounter: Payer: Self-pay | Admitting: Nurse Practitioner

## 2014-12-15 NOTE — Assessment & Plan Note (Addendum)
Pt is down 8 lbs from last visit. She reports much improvement in feeling good. She does report loose stools. Asked her to cut back from 1/2 tablet twice daily to 1/2 tablet once daily and see if that is helpful. Pt was given 2 months more of the prescription and asked to f/u in 2 months. She was advised to continue lifestyle changes because phentermine will be stopped at end of 3 months due to cardiac risks. Pt agreeable.

## 2014-12-15 NOTE — Assessment & Plan Note (Addendum)
Stable, but requesting xanax refill. Reviewed that Xanax is only meant for prn moderate to severe anxiety and is not a long term solution. Pt verbalized understanding. [place] checked for compliance. Printed, signed, and faxed. Will follow

## 2014-12-27 ENCOUNTER — Other Ambulatory Visit: Payer: Self-pay | Admitting: Nurse Practitioner

## 2015-02-03 ENCOUNTER — Ambulatory Visit: Payer: Medicare Other | Admitting: Nurse Practitioner

## 2015-02-03 ENCOUNTER — Telehealth: Payer: Self-pay | Admitting: *Deleted

## 2015-02-03 ENCOUNTER — Other Ambulatory Visit: Payer: Self-pay | Admitting: Nurse Practitioner

## 2015-02-10 ENCOUNTER — Encounter: Payer: Self-pay | Admitting: Nurse Practitioner

## 2015-02-10 ENCOUNTER — Ambulatory Visit (INDEPENDENT_AMBULATORY_CARE_PROVIDER_SITE_OTHER): Payer: Medicare Other | Admitting: Nurse Practitioner

## 2015-02-10 VITALS — BP 124/82 | HR 68 | Temp 97.9°F | Resp 14 | Ht 68.0 in | Wt 175.6 lb

## 2015-02-10 DIAGNOSIS — E663 Overweight: Secondary | ICD-10-CM

## 2015-02-10 DIAGNOSIS — E559 Vitamin D deficiency, unspecified: Secondary | ICD-10-CM

## 2015-02-10 MED ORDER — AMLODIPINE BESYLATE 5 MG PO TABS: ORAL_TABLET | ORAL | Status: DC

## 2015-02-10 MED ORDER — ALPRAZOLAM 0.25 MG PO TABS: 0.2500 mg | ORAL_TABLET | Freq: Two times a day (BID) | ORAL | Status: DC | PRN

## 2015-02-10 MED ORDER — VITAMIN D (ERGOCALCIFEROL) 1.25 MG (50000 UNIT) PO CAPS: 50000.0000 [IU] | ORAL_CAPSULE | ORAL | Status: DC

## 2015-02-10 MED ORDER — LEVOTHYROXINE SODIUM 112 MCG PO TABS: 112.0000 ug | ORAL_TABLET | Freq: Every day | ORAL | Status: DC

## 2015-02-10 MED ORDER — ROSUVASTATIN CALCIUM 10 MG PO TABS: 10.0000 mg | ORAL_TABLET | Freq: Every day | ORAL | Status: DC

## 2015-02-10 MED ORDER — OMEPRAZOLE 20 MG PO CPDR: DELAYED_RELEASE_CAPSULE | ORAL | Status: DC

## 2015-02-10 MED ORDER — FENOFIBRATE MICRONIZED 134 MG PO CAPS: ORAL_CAPSULE | ORAL | Status: DC

## 2015-02-10 NOTE — Progress Notes (Signed)
Patient ID: Darlene Mcdowell, female    DOB: 12-Jun-1950  Age: 65 y.o. MRN: [id number]  CC: Follow-up   HPI Darlene Mcdowell presents for follow up of weight loss and labs.   1) Still losing weight off of phentermine. Keeping up with diet and exercise. She would ideally like to loose a few more pounds. She reports a cheat day every once and again.   2) Not taking vitamin D Vitamin D level was 15    Wt Readings from Last 3 Encounters:  02/10/15 175 lb 9.6 oz (79.652 kg)  12/03/14 184 lb 8 oz (83.689 kg)  11/05/14 192 lb 9.6 oz (87.363 kg)   History Darlene Mcdowell has a past medical history of Hypertension; Hyperlipidemia; and Thyroid disease.   She has past surgical history that includes Tubal ligation [id number]) and Spine surgery (01/12/2003).   Her family history includes Cancer in her maternal grandmother; Heart disease in her father, maternal grandfather, and paternal grandmother; Hyperlipidemia in her father; Hypertension in her father and mother; Stroke in her mother.She reports that she quit smoking about 12 years ago. She does not have any smokeless tobacco history on file. She reports that she drinks alcohol. She reports that she does not use illicit drugs.  Outpatient Prescriptions Prior to Visit  Medication Sig Dispense Refill  . ALPRAZolam (XANAX) 0.25 MG tablet Take 1 tablet (0.25 mg total) by mouth 2 (two) times daily as needed. for anxiety 60 tablet 2  . amLODipine (NORVASC) 5 MG tablet TAKE 1 TABLET(5 MG) BY MOUTH AT BEDTIME 30 tablet 0  . fenofibrate micronized (LOFIBRA) 134 MG capsule TAKE 1 CAPSULE(134 MG) BY MOUTH AT BEDTIME 30 capsule 0  . levothyroxine (SYNTHROID, LEVOTHROID) 112 MCG tablet Take 1 tablet (112 mcg total) by mouth daily before breakfast. 90 tablet 3  . omeprazole (PRILOSEC) 20 MG capsule TAKE 1 CAPSULE(20 MG) BY MOUTH DAILY. SWALLOW WHOLE. DO NOT CRUSH 30 capsule 0  . phentermine (ADIPEX-P) 37.5 MG tablet Take 1 tablet (37.5 mg total) by mouth daily before breakfast. 30  tablet 1  . pravastatin (PRAVACHOL) 40 MG tablet TAKE 1 TABLET(40 MG) BY MOUTH AT BEDTIME 30 tablet 0  . triamcinolone ointment (KENALOG) 0.5 % Apply 1 application topically 2 (two) times daily. (Patient not taking: Reported on 02/10/2015) 30 g 0   No facility-administered medications prior to visit.    ROS Review of Systems  Constitutional: Negative for fever, chills, diaphoresis and fatigue.  Respiratory: Negative for chest tightness, shortness of breath and wheezing.   Cardiovascular: Negative for chest pain, palpitations and leg swelling.  Gastrointestinal: Negative for nausea, vomiting and diarrhea.  Skin: Negative for rash.  Neurological: Negative for dizziness, weakness, numbness and headaches.  Psychiatric/Behavioral: The patient is not nervous/anxious.    Objective:  BP 124/82 mmHg  Pulse 68  Temp(Src) 97.9 F (36.6 C) (Oral)  Resp 14  Ht 5\' 8"  (1.727 m)  Wt 175 lb 9.6 oz (79.652 kg)  BMI 26.71 kg/m2  SpO2 97%  LMP 01/11/1994  Physical Exam  Constitutional: She is oriented to person, place, and time. She appears well-developed and well-nourished. No distress.  HENT:  Head: Normocephalic and atraumatic.  Right Ear: External ear normal.  Left Ear: External ear normal.  Cardiovascular: Normal rate, regular rhythm and normal heart sounds.  Exam reveals no gallop and no friction rub.   No murmur heard. Pulmonary/Chest: Effort normal and breath sounds normal. No respiratory distress. She has no wheezes. She has no rales. She exhibits no  tenderness.  Neurological: She is alert and oriented to person, place, and time. No cranial nerve deficit. She exhibits normal muscle tone. Coordination normal.  Skin: Skin is warm and dry. No rash noted. She is not diaphoretic.  Psychiatric: She has a normal mood and affect. Her behavior is normal. Judgment and thought content normal.   Assessment & Plan:   Darlene Mcdowell was seen today for follow-up.  Diagnoses and all orders for this  visit:  Overweight (BMI 25.0-29.9)  Vitamin D deficiency  Other orders -     Vitamin D, Ergocalciferol, (DRISDOL) 50000 units CAPS capsule; Take 1 capsule (50,000 Units total) by mouth every 7 (seven) days. -     rosuvastatin (CRESTOR) 10 MG tablet; Take 1 tablet (10 mg total) by mouth daily. -     ALPRAZolam (XANAX) 0.25 MG tablet; Take 1 tablet (0.25 mg total) by mouth 2 (two) times daily as needed. for anxiety -     amLODipine (NORVASC) 5 MG tablet; TAKE 1 TABLET(5 MG) BY MOUTH AT BEDTIME -     fenofibrate micronized (LOFIBRA) 134 MG capsule; TAKE 1 CAPSULE(134 MG) BY MOUTH AT BEDTIME -     levothyroxine (SYNTHROID, LEVOTHROID) 112 MCG tablet; Take 1 tablet (112 mcg total) by mouth daily before breakfast. -     omeprazole (PRILOSEC) 20 MG capsule; TAKE 1 CAPSULE(20 MG) BY MOUTH DAILY. SWALLOW WHOLE. DO NOT CRUSH  I have discontinued Darlene Mcdowell's triamcinolone ointment, phentermine, and pravastatin. I am also having her start on Vitamin D (Ergocalciferol) and rosuvastatin. Additionally, I am having her maintain her ALPRAZolam, amLODipine, fenofibrate micronized, levothyroxine, and omeprazole.  Meds ordered this encounter  Medications  . Vitamin D, Ergocalciferol, (DRISDOL) 50000 units CAPS capsule    Sig: Take 1 capsule (50,000 Units total) by mouth every 7 (seven) days.    Dispense:  12 capsule    Refill:  1    Order Specific Question:  Supervising Provider    Answer:  Derrel Nip, TERESA L [[id number]]  . rosuvastatin (CRESTOR) 10 MG tablet    Sig: Take 1 tablet (10 mg total) by mouth daily.    Dispense:  30 tablet    Refill:  0    Order Specific Question:  Supervising Provider    Answer:  Deborra Medina L [[id number]]  . ALPRAZolam (XANAX) 0.25 MG tablet    Sig: Take 1 tablet (0.25 mg total) by mouth 2 (two) times daily as needed. for anxiety    Dispense:  60 tablet    Refill:  3    Order Specific Question:  Supervising Provider    Answer:  Deborra Medina L [[id number]]  . amLODipine (NORVASC) 5 MG  tablet    Sig: TAKE 1 TABLET(5 MG) BY MOUTH AT BEDTIME    Dispense:  30 tablet    Refill:  5    Order Specific Question:  Supervising Provider    Answer:  Deborra Medina L [[id number]]  . fenofibrate micronized (LOFIBRA) 134 MG capsule    Sig: TAKE 1 CAPSULE(134 MG) BY MOUTH AT BEDTIME    Dispense:  30 capsule    Refill:  5    Order Specific Question:  Supervising Provider    Answer:  Deborra Medina L [[id number]]  . levothyroxine (SYNTHROID, LEVOTHROID) 112 MCG tablet    Sig: Take 1 tablet (112 mcg total) by mouth daily before breakfast.    Dispense:  30 tablet    Refill:  5    Order Specific Question:  Supervising Provider  Answer:  TULLO, TERESA L [[id number]]  . omeprazole (PRILOSEC) 20 MG capsule    Sig: TAKE 1 CAPSULE(20 MG) BY MOUTH DAILY. SWALLOW WHOLE. DO NOT CRUSH    Dispense:  30 capsule    Refill:  5    Order Specific Question:  Supervising Provider    Answer:  Crecencio Mc [[id number]]     Follow-up: Return in about 6 months (around 08/10/2015) for Follow up.

## 2015-02-10 NOTE — Patient Instructions (Signed)
Your medications are refilled.   See you in 6 months!

## 2015-02-11 DIAGNOSIS — E559 Vitamin D deficiency, unspecified: Secondary | ICD-10-CM | POA: Insufficient documentation

## 2015-02-11 NOTE — Assessment & Plan Note (Signed)
Vit D level was 15. Sent in Vitamin D 1 x weekly for 12 weeks with 1 refill. Will follow

## 2015-02-11 NOTE — Assessment & Plan Note (Signed)
BMI in Sept. Was 30 Today BMI is 26 Pt continues to lose weight even off of phentermine I congratulated her on this effort and encouraged her to reach her final goal, which is a few more lbs down.   Wt Readings from Last 3 Encounters:  02/10/15 175 lb 9.6 oz (79.652 kg)  12/03/14 184 lb 8 oz (83.689 kg)  11/05/14 192 lb 9.6 oz (87.363 kg)

## 2015-02-20 ENCOUNTER — Other Ambulatory Visit: Payer: Self-pay | Admitting: Nurse Practitioner

## 2015-02-20 MED ORDER — PRAVASTATIN SODIUM 40 MG PO TABS: 40.0000 mg | ORAL_TABLET | Freq: Every day | ORAL | Status: DC

## 2015-02-24 ENCOUNTER — Encounter: Payer: Self-pay | Admitting: Nurse Practitioner

## 2015-04-16 ENCOUNTER — Other Ambulatory Visit: Payer: Self-pay | Admitting: Nurse Practitioner

## 2015-04-21 ENCOUNTER — Other Ambulatory Visit: Payer: Self-pay | Admitting: Nurse Practitioner

## 2015-04-21 DIAGNOSIS — Z1231 Encounter for screening mammogram for malignant neoplasm of breast: Secondary | ICD-10-CM

## 2015-04-30 ENCOUNTER — Other Ambulatory Visit: Payer: Self-pay | Admitting: Nurse Practitioner

## 2015-04-30 ENCOUNTER — Ambulatory Visit
Admission: RE | Admit: 2015-04-30 | Discharge: 2015-04-30 | Disposition: A | Discharge status: Home / Self Care | Payer: Medicare Other | Source: Ambulatory Visit | Attending: Nurse Practitioner | Admitting: Nurse Practitioner

## 2015-04-30 DIAGNOSIS — Z1231 Encounter for screening mammogram for malignant neoplasm of breast: Secondary | ICD-10-CM | POA: Diagnosis present

## 2015-05-10 ENCOUNTER — Other Ambulatory Visit: Payer: Self-pay | Admitting: Nurse Practitioner

## 2015-06-03 ENCOUNTER — Encounter: Payer: Self-pay | Admitting: Primary Care

## 2015-06-03 ENCOUNTER — Ambulatory Visit (INDEPENDENT_AMBULATORY_CARE_PROVIDER_SITE_OTHER): Payer: Medicare Other | Admitting: Primary Care

## 2015-06-03 VITALS — BP 118/72 | HR 70 | Temp 98.0°F | Ht 68.0 in | Wt 177.1 lb

## 2015-06-03 DIAGNOSIS — E785 Hyperlipidemia, unspecified: Secondary | ICD-10-CM | POA: Diagnosis not present

## 2015-06-03 DIAGNOSIS — F418 Other specified anxiety disorders: Secondary | ICD-10-CM

## 2015-06-03 DIAGNOSIS — E039 Hypothyroidism, unspecified: Secondary | ICD-10-CM | POA: Diagnosis not present

## 2015-06-03 DIAGNOSIS — E559 Vitamin D deficiency, unspecified: Secondary | ICD-10-CM

## 2015-06-03 DIAGNOSIS — F411 Generalized anxiety disorder: Secondary | ICD-10-CM

## 2015-06-03 DIAGNOSIS — I10 Essential (primary) hypertension: Secondary | ICD-10-CM

## 2015-06-03 MED ORDER — ALPRAZOLAM 0.25 MG PO TABS: ORAL_TABLET | ORAL | Status: DC

## 2015-06-03 MED ORDER — LEVOTHYROXINE SODIUM 112 MCG PO TABS: 112.0000 ug | ORAL_TABLET | Freq: Every day | ORAL | Status: DC

## 2015-06-03 MED ORDER — HYDROXYZINE HCL 25 MG PO TABS: ORAL_TABLET | ORAL | Status: DC

## 2015-06-03 MED ORDER — PRAVASTATIN SODIUM 40 MG PO TABS: 40.0000 mg | ORAL_TABLET | Freq: Every day | ORAL | Status: DC

## 2015-06-03 NOTE — Patient Instructions (Signed)
We are weaning you off of your Alprazolam slowly.  Take 1/2 tablet by mouth twice daily as needed for 3 weeks, then 1/2 tablet by mouth once daily as needed for 3 weeks.  I've sent hydroxyzine to your pharmacy to help with breakthrough anxiety and for sleep. Take 1 tablet by mouth up to three times daily. Start by taking this at bedtime. This may cause daytime drowsiness which is why i'd like for you to try this at night.  I sent refills of your levothyroxine and pravastatin to your pharmacy.  Stop your Amlodipine for blood pressure. Monitor your blood pressure daily for the next 2 weeks and send me your readings via My Chart.  Please schedule a physical with me within the next 2-3 months. You may also schedule a lab only appointment 3-4 days prior. We will discuss your lab results in detail during your physical.  It was a pleasure to meet you today! Please don't hesitate to call me with any questions. Welcome to [facility]!

## 2015-06-03 NOTE — Assessment & Plan Note (Signed)
Taking Xanax PRN HS and also once every morning. Long discussion today regarding the long term effects of this medication and the importance of weaning off. Will start her with a slow taper off over the next 6 weeks.  Will supplement with hydroxyzine to use HS as needed for sleep and anxiety. GAD 7 score of 9 today, doesn't seem like she has GAD, more so situational anxiety. Discussed coping mechanisms.  Will not continue to fill Xanax. Patient aware.

## 2015-06-03 NOTE — Progress Notes (Signed)
Pre visit review using our clinic review tool, if applicable. No additional management support is needed unless otherwise documented below in the visit note. 

## 2015-06-03 NOTE — Progress Notes (Signed)
Subjective:    Patient ID: Darlene Mcdowell, female    DOB: 1950/05/22, 65 y.o.   MRN: [id number]  HPI  Darlene Mcdowell is a 65 year old female who presents today to transfer care from [place]. Her last physical was   1) Hypothyroidism: Diagnosed 20 years ago. Currently managed on levothyroxine 112 mcg. Last TSH in June 2016 stable. She's not required a recent dose change.  2) Anxiety: Diagnosed 20 years ago, mostly situational as she was caring for her mother. She is currently managed solely on Alprazolam that she's taken for 15 years. She takes the alprazolam twice daily, once in the morning and once in the evening. She was once managed on Paxil in the past but stopped taking as it caused her to feel unlike herself. GAD 7 score of 9 today. Denies symptoms of depression. She will alternate Melatonin and Advil PM sometimes at night as she wakes up during the night. She doesn't feel as though she has anxiety disorder, but will experience anxiety during random situations.  3) Hyperlipidemia: Currently managed on pravastatin 40 mg daily and fenofibrate. Last lipid panel in December 2016 with slight elevation in triglycerides. She's working to improve her diet.   4) Vitamin D Deficiency: Level of 15 in December 2016. She was treated with Vitamin D 50,000 unit capsules and is still taking. No recent re-evaluation in her vitamin D levels since.  5) Essential Hypertension: Currently managed on Amlodipine 5 mg. Blood pressure in January 2017 stable and also in office today. Denies chest pain, dizziness, fatigue. Her BP was more elevated when she was caring for her elderly, ill mother. Her mother has since passed away and the patient is ready to come off of the Amlodipine.   Review of Systems  Constitutional: Negative for fatigue.  Respiratory: Negative for shortness of breath.   Cardiovascular: Negative for chest pain.  Endocrine: Negative for cold intolerance.  Neurological: Negative  for dizziness and headaches.  Psychiatric/Behavioral:       See HPI       Past Medical History  Diagnosis Date  . Hypertension   . Hyperlipidemia   . Thyroid disease     under active thyroid problems  . GERD (gastroesophageal reflux disease)   . Anxiety   . Vitamin D deficiency      Social History   Social History  . Marital Status: Married    Spouse Name: N/A  . Number of Children: N/A  . Years of Education: N/A   Occupational History  . Not on file.   Social History Main Topics  . Smoking status: Former Smoker    Quit date: 01/12/2003  . Smokeless tobacco: Not on file  . Alcohol Use: 0.0 oz/week    0 Standard drinks or equivalent per week     Comment: Occasional glass of wine  . Drug Use: No  . Sexual Activity:    Partners: Male     Comment: With husband   Other Topics Concern  . 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 Tubal ligation  1981  .  Spine surgery  01/12/2003    cervical c-7    Family History  Problem Relation Age of Onset  . Stroke Mother   . Hypertension Mother   . Hyperlipidemia Father   . Heart disease Father   . Hypertension Father   . Cancer Maternal Grandmother   . Heart disease Maternal Grandfather   . Heart disease Paternal Grandmother   . Breast cancer Neg Hx     No Known Allergies  Current Outpatient Prescriptions on File Prior to Visit  Medication Sig Dispense Refill  . amLODipine (NORVASC) 5 MG tablet TAKE 1 TABLET(5 MG) BY MOUTH AT BEDTIME 30 tablet 5  . omeprazole (PRILOSEC) 20 MG capsule TAKE 1 CAPSULE(20 MG) BY MOUTH DAILY. SWALLOW WHOLE. DO NOT CRUSH 30 capsule 5  . Vitamin D, Ergocalciferol, (DRISDOL) 50000 units  CAPS capsule Take 1 capsule (50,000 Units total) by mouth every 7 (seven) days. 12 capsule 1   No current facility-administered medications on file prior to visit.    BP 118/72 mmHg  Pulse 70  Temp(Src) 98 F (36.7 C) (Oral)  Ht 5\' 8"  (1.727 m)  Wt 177 lb 1.9 oz (80.341 kg)  BMI 26.94 kg/m2  SpO2 97%  LMP 01/11/1994    Objective:   Physical Exam  Constitutional: She appears well-nourished.  Neck: Neck supple.  Cardiovascular: Normal rate and regular rhythm.   Pulmonary/Chest: Effort normal and breath sounds normal.  Skin: Skin is warm and dry.  Psychiatric: She has a normal mood and affect.          Assessment & Plan:

## 2015-06-03 NOTE — Assessment & Plan Note (Signed)
Managed on vitamin D since December 2016. Will recheck levels at upcoming physical this summer. Will likely have her supplement with OTC vitamin D.

## 2015-06-03 NOTE — Assessment & Plan Note (Signed)
Elevated trigs in December. Managed on Fenofibrate and pravastatin. Repeat lipids this summer at upcoming physical.

## 2015-06-03 NOTE — Assessment & Plan Note (Signed)
Managed on Amlodipine 5 mg for years. She is interested in coming off and will try as she is on a low dose with a BP of 118/72. Will trial her off medication and have her monitor her BP for the next 2 weeks.

## 2015-06-03 NOTE — Assessment & Plan Note (Signed)
TSH in June 2016 stable. Continue current regimen at 112 mcg. Will recheck at upcoming physical this summer.

## 2015-06-17 ENCOUNTER — Telehealth: Payer: Self-pay | Admitting: Primary Care

## 2015-06-17 NOTE — Telephone Encounter (Signed)
Message left for patient to return my call.  

## 2015-06-17 NOTE — Telephone Encounter (Signed)
-----   Message from Pleas Koch, NP sent at 06/03/2015  2:41 PM EDT ----- Regarding: BP Please check on BP readings since we removed her from her Amlodipine.

## 2015-06-18 ENCOUNTER — Encounter: Payer: Self-pay | Admitting: Primary Care

## 2015-06-18 NOTE — Telephone Encounter (Signed)
Lm on pts vm requesting a call back with mist recent BP readings

## 2015-07-18 ENCOUNTER — Other Ambulatory Visit: Payer: Self-pay | Admitting: Nurse Practitioner

## 2015-08-03 ENCOUNTER — Other Ambulatory Visit: Payer: Self-pay | Admitting: Primary Care

## 2015-08-03 DIAGNOSIS — E785 Hyperlipidemia, unspecified: Secondary | ICD-10-CM

## 2015-08-03 DIAGNOSIS — E559 Vitamin D deficiency, unspecified: Secondary | ICD-10-CM

## 2015-08-03 DIAGNOSIS — E039 Hypothyroidism, unspecified: Secondary | ICD-10-CM

## 2015-08-03 DIAGNOSIS — I10 Essential (primary) hypertension: Secondary | ICD-10-CM

## 2015-08-05 ENCOUNTER — Other Ambulatory Visit (INDEPENDENT_AMBULATORY_CARE_PROVIDER_SITE_OTHER): Payer: Medicare Other

## 2015-08-05 DIAGNOSIS — I10 Essential (primary) hypertension: Secondary | ICD-10-CM

## 2015-08-05 DIAGNOSIS — E039 Hypothyroidism, unspecified: Secondary | ICD-10-CM | POA: Diagnosis not present

## 2015-08-05 DIAGNOSIS — E785 Hyperlipidemia, unspecified: Secondary | ICD-10-CM

## 2015-08-05 DIAGNOSIS — E559 Vitamin D deficiency, unspecified: Secondary | ICD-10-CM | POA: Diagnosis not present

## 2015-08-05 LAB — COMPREHENSIVE METABOLIC PANEL
ALBUMIN: 4.5 g/dL (ref 3.5–5.2)
ALT: 45 U/L — ABNORMAL HIGH (ref 0–35)
AST: 31 U/L (ref 0–37)
Alkaline Phosphatase: 46 U/L (ref 39–117)
BUN: 19 mg/dL (ref 6–23)
CHLORIDE: 104 meq/L (ref 96–112)
CO2: 31 meq/L (ref 19–32)
CREATININE: 0.94 mg/dL (ref 0.40–1.20)
Calcium: 10.3 mg/dL (ref 8.4–10.5)
GFR: 63.24 mL/min (ref >60.00)
Glucose, Bld: 104 mg/dL — ABNORMAL HIGH (ref 70–99)
POTASSIUM: 4.3 meq/L (ref 3.5–5.1)
SODIUM: 140 meq/L (ref 135–145)
Total Bilirubin: 0.5 mg/dL (ref 0.2–1.2)
Total Protein: 6.7 g/dL (ref 6.0–8.3)

## 2015-08-05 LAB — LIPID PANEL
CHOL/HDL RATIO: 5
CHOLESTEROL: 154 mg/dL (ref 0–200)
HDL: 34 mg/dL — ABNORMAL LOW (ref >39.00)
LDL CALC: 96 mg/dL (ref 0–99)
NonHDL: 120.09
Triglycerides: 118 mg/dL (ref 0.0–149.0)
VLDL: 23.6 mg/dL (ref 0.0–40.0)

## 2015-08-05 LAB — TSH: TSH: 0.28 u[IU]/mL — AB (ref 0.35–4.50)

## 2015-08-05 LAB — VITAMIN D 25 HYDROXY (VIT D DEFICIENCY, FRACTURES): VITD: 54.83 ng/mL (ref 30.00–100.00)

## 2015-08-06 ENCOUNTER — Other Ambulatory Visit: Payer: Medicare Other

## 2015-08-11 ENCOUNTER — Ambulatory Visit: Payer: Medicare Other | Admitting: Nurse Practitioner

## 2015-08-11 ENCOUNTER — Encounter: Payer: Medicare Other | Admitting: Primary Care

## 2015-08-13 ENCOUNTER — Encounter: Payer: Medicare Other | Admitting: Primary Care

## 2015-09-05 ENCOUNTER — Ambulatory Visit (INDEPENDENT_AMBULATORY_CARE_PROVIDER_SITE_OTHER): Payer: Medicare Other | Admitting: Primary Care

## 2015-09-05 ENCOUNTER — Encounter: Payer: Self-pay | Admitting: Primary Care

## 2015-09-05 VITALS — BP 140/80 | HR 72 | Temp 97.6°F | Ht 67.5 in | Wt 180.5 lb

## 2015-09-05 DIAGNOSIS — Z0000 Encounter for general adult medical examination without abnormal findings: Secondary | ICD-10-CM

## 2015-09-05 DIAGNOSIS — E785 Hyperlipidemia, unspecified: Secondary | ICD-10-CM

## 2015-09-05 DIAGNOSIS — E559 Vitamin D deficiency, unspecified: Secondary | ICD-10-CM | POA: Diagnosis not present

## 2015-09-05 DIAGNOSIS — E2839 Other primary ovarian failure: Secondary | ICD-10-CM

## 2015-09-05 DIAGNOSIS — F411 Generalized anxiety disorder: Secondary | ICD-10-CM

## 2015-09-05 DIAGNOSIS — E039 Hypothyroidism, unspecified: Secondary | ICD-10-CM

## 2015-09-05 DIAGNOSIS — I10 Essential (primary) hypertension: Secondary | ICD-10-CM

## 2015-09-05 DIAGNOSIS — E663 Overweight: Secondary | ICD-10-CM

## 2015-09-05 DIAGNOSIS — F418 Other specified anxiety disorders: Secondary | ICD-10-CM

## 2015-09-05 MED ORDER — LEVOTHYROXINE SODIUM 100 MCG PO TABS: 100.0000 ug | ORAL_TABLET | Freq: Every day | ORAL | 0 refills | Status: DC

## 2015-09-05 MED ORDER — ALPRAZOLAM 0.25 MG PO TABS: ORAL_TABLET | ORAL | 0 refills | Status: DC

## 2015-09-05 NOTE — Assessment & Plan Note (Signed)
Recent TSH of 0.28, lowered levothyroxine to 100 mcg. Repeat labs in 2 months.

## 2015-09-05 NOTE — Assessment & Plan Note (Signed)
Immunizations UTD. Information provided regarding [employer] for colon cancer screening. Declines pap, will defer to next year, history of normal Pap's in the past. Mammogram UTD. Bone Density testing due and pending. Labs with hypothyroidism, levothyroxine adjusted accordingly, otherwise unremarkable. Continue with Vitamin D 1000 units daily for deficiency. ECG today unremarkable. Discussed the importance of a healthy diet and regular exercise in order for weight loss and to reduce risk of other medical diseases.  I have personally reviewed and have noted: 1. The patient's medical and social history 2. Their use of alcohol, tobacco or illicit drugs 3. Their current medications and supplements 4. The patient's functional ability including ADL's, fall  risks, home safety risks and hearing or visual  impairment. 5. Diet and physical activities 6. Evidence for depression or mood disorder  Follow up in 1 year for initial medicare visit

## 2015-09-05 NOTE — Assessment & Plan Note (Signed)
Improved on 50,000 unit capsules once weekly. Continue with 1000 units once daily.

## 2015-09-05 NOTE — Patient Instructions (Addendum)
Your ECG looks normal.  I do recommend a pelvic examination with Pap testing.  Start Vitamin D 1000 unit capsules once daily to maintain your levels.  You will be contacted regarding your Bone Density Testing.  Please let us know if you have not heard back within one week.   You will be sent information regarding colon cancer screening through [employer]. I will notify you of your results once received.  Continue your efforts towards a healthy lifestyle through diet and exercise.   Start exercising. You should be getting 1 hour of moderate intensity exercise 3-5 days weekly.  Ensure you are consuming 64 ounces of water daily. Reduce consumption of diet soda.  Complete the advance directives packet and return a copy of the [place] to our front office.  We've decreased your levothyroxine from 112 mcg to 100 mcg. Pick up the new prescription at your pharmacy.  Schedule a lab only appointment in 2 months for recheck of your thyroid levels.  Follow up in 1 year for your annual wellness visit.  It was a pleasure to see you today!

## 2015-09-05 NOTE — Assessment & Plan Note (Signed)
Has weaned down off Xanax and is taking one half tablet of 0.25 mg once daily. Hydroxyzine caused headaches. Will have her continue a gradual taper off of the next several weeks. Refill provided today.

## 2015-09-05 NOTE — Progress Notes (Signed)
Pre visit review using our clinic review tool, if applicable. No additional management support is needed unless otherwise documented below in the visit note. 

## 2015-09-05 NOTE — Assessment & Plan Note (Signed)
Overall stable. Continue pravastatin 20 mg. LFT's stable.

## 2015-09-05 NOTE — Assessment & Plan Note (Signed)
Stable during exam today given age and guidelines. Will continue to monitor.

## 2015-09-05 NOTE — Assessment & Plan Note (Signed)
Overall fair diet but does not exercise. Recommended moderate intensity exercise 3-5 days weekly.

## 2015-09-05 NOTE — Progress Notes (Signed)
Patient ID: Darlene Mcdowell, female   DOB: Aug 19, 1950, 65 y.o.   MRN: [id number]  HPI: Darlene Mcdowell is a 65 year old female who repents for a Welcome to Medicare Visit.   Past Medical History:  Diagnosis Date  . Anxiety   . GERD (gastroesophageal reflux disease)   . Hyperlipidemia   . Hypertension   . Thyroid disease    under active thyroid problems  . Vitamin D deficiency     Current Outpatient Prescriptions  Medication Sig Dispense Refill  . ALPRAZolam (XANAX) 0.25 MG tablet Take 1/2 tablet twice daily as needed for 3 weeks, then 1/2 tablet once daily as needed for 3 weeks. 32 tablet 0  . Coenzyme Q10 (COQ10 PO) Take 300 mg by mouth at bedtime.    . fenofibrate micronized (LOFIBRA) 134 MG capsule   5  . levothyroxine (SYNTHROID, LEVOTHROID) 112 MCG tablet Take 1 tablet (112 mcg total) by mouth daily before breakfast. 90 tablet 2  . omeprazole (PRILOSEC) 20 MG capsule TAKE 1 CAPSULE(20 MG) BY MOUTH DAILY. SWALLOW WHOLE. DO NOT CRUSH 30 capsule 5  . pravastatin (PRAVACHOL) 40 MG tablet Take 1 tablet (40 mg total) by mouth daily. 90 tablet 2  . hydrOXYzine (ATARAX/VISTARIL) 25 MG tablet Take 1 tablet by mouth up to three times daily as needed for anxiety/sleep. (Patient not taking: Reported on 09/05/2015) 30 tablet 1   No current facility-administered medications for this visit.     No Known Allergies  Family History  Problem Relation Age of Onset  . Stroke Mother   . Hypertension Mother   . Hyperlipidemia Father   . Heart disease Father   . Hypertension Father   . Cancer Maternal Grandmother   . Heart disease Maternal Grandfather   . Heart disease Paternal Grandmother   . Breast cancer Neg Hx     Social History   Social History  . Marital status: Married    Spouse name: N/A  . Number of children: N/A  . Years of education: N/A   Occupational History  . Not on file.   Social History Main Topics  . Smoking status: Former Smoker    Quit date: 01/12/2003  . Smokeless  tobacco: Not on file  . Alcohol use 0.0 oz/week     Comment: Occasional glass of wine  . Drug use: No  . Sexual activity: Yes    Partners: Male     Comment: With husband   Other Topics Concern  . 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   Colonoscopy: Never completed, interested in [place] Doctor: Completes annually  Dental Exam: Completes annually  Mammogram: Completed in 2017, normal.  Pap: Completed over 3 years ago.     Providers: Alma Friendly, PCP; Dr. Melina Copa, Dentist; [facility], Optometry   I have personally reviewed and have noted: 1. The patient's medical and social history 2. Their  use of alcohol, tobacco or illicit drugs 3. Their current medications and supplements 4. The patient's functional ability including ADL's, fall risks, home safety risks  and hearing or visual impairment. 5. Diet and physical activities 6. Evidence for depression or mood disorder  Subjective:   Review of Systems:   Constitutional: Denies fever, malaise, fatigue, headache or abrupt weight changes.  HEENT: Denies eye pain, eye redness, ear pain, ringing in the ears, wax buildup, runny nose, nasal congestion, bloody nose, or sore throat. Respiratory: Denies difficulty breathing, shortness of breath, cough or sputum production.   Cardiovascular: Denies chest pain, chest tightness, palpitations or swelling in the hands or feet.  Gastrointestinal: Denies abdominal pain, bloating,  constipation, diarrhea or blood in the stool.  GU: Denies urgency, frequency, pain with urination, burning sensation, blood in urine, odor or discharge. Musculoskeletal: Denies decrease in range of motion, difficulty with gait, muscle pain or joint pain and swelling.  Skin: Denies redness, rashes, lesions or ulcercations.  Neurological: Denies dizziness, difficulty with memory, difficulty with speech or problems with balance and coordination.   No other specific complaints in a complete review of systems (except as listed in HPI above).  Objective:  PE:   BP 140/80   Pulse 72   Temp 97.6 F (36.4 C) (Oral)   Ht 5' 7.5" (1.715 m)   Wt 180 lb 8 oz (81.9 kg)   LMP 01/11/1994   SpO2 97%   BMI 27.85 kg/m  Wt Readings from Last 3 Encounters:  09/05/15 180 lb 8 oz (81.9 kg)  06/03/15 177 lb 1.9 oz (80.3 kg)  02/10/15 175 lb 9.6 oz (79.7 kg)    General: Appears their stated age, well developed, well nourished in NAD. Skin: Warm, dry and intact. No rashes, lesions or ulcerations noted. HEENT: Head: normal shape and size; Eyes: sclera white, no icterus, conjunctiva pink, PERRLA and EOMs intact; Ears: Tm's gray and intact, normal light reflex; Nose: mucosa pink and moist, septum midline; Throat/Mouth: Teeth present, mucosa pink and moist, no exudate, lesions or ulcerations noted.  Neck: Normal range of motion. Neck supple, trachea midline. No massses, lumps or thyromegaly present.  Cardiovascular: Normal rate and rhythm. S1,S2 noted.  No murmur, rubs or gallops noted. No JVD or BLE edema. No carotid bruits noted. Pulmonary/Chest: Normal effort and positive vesicular breath sounds. No respiratory distress. No wheezes, rales or ronchi noted.  Abdomen: Soft and nontender. Normal bowel sounds, no bruits noted. No distention or masses noted. Liver, spleen and kidneys non palpable. Musculoskeletal: Normal range of motion. No signs of joint swelling. No difficulty with gait.  Neurological: Alert  and oriented. Cranial nerves II-XII intact. Coordination normal. +DTRs bilaterally. Psychiatric: Mood and affect normal. Behavior is normal. Judgment and thought content normal.   EKG: NSR, Rate of 65, T-wave inversion to V1 and V2, no ST abnormality.  BMET    Component Value Date/Time   NA 140 08/05/2015 0954   NA 142 01/09/2013   K 4.3 08/05/2015 0954   CL 104 08/05/2015 0954   CO2 31 08/05/2015 0954   GLUCOSE 104 (H) 08/05/2015 0954   BUN 19 08/05/2015 0954   BUN 14 01/09/2013   CREATININE 0.94 08/05/2015 0954   CALCIUM 10.3 08/05/2015 0954    Lipid Panel     Component Value Date/Time   CHOL 154 08/05/2015 0954   TRIG 118.0 08/05/2015 0954   HDL 34.00 (L) 08/05/2015 0954   CHOLHDL 5 08/05/2015 0954   VLDL 23.6 08/05/2015 0954  LDLCALC 96 08/05/2015 0954    CBC    Component Value Date/Time   WBC 6.0 01/09/2014   HGB 13.3 01/09/2014   HCT 41 01/09/2014   PLT 210 01/09/2014    Hgb A1C No results found for: HGBA1C    Assessment and Plan:   Medicare Annual Wellness Visit:  Diet: Heart healthy Breakfast: Skips Lunch: Salad, soup, box meal Dinner: Meat, vegetable, rice, pasta, potatoes  Snacks: Chips, nuts, yogurt Desserts: Occasionally Beverages: Diet soda, coffee, hot tea, some water and sweet tea Physical activity: She is active but does not regularly exercise.  Depression/mood screen: Negative Hearing: Intact to whispered voice Visual acuity: Grossly normal, performs annual eye exam  ADLs: Capable Fall risk: None Home safety: Good Cognitive evaluation: Intact to orientation, naming, recall and repetition EOL planning: Adv directives, full code  Preventative Medicine: Immunizations UTD. Information provided regarding Cologuard for colon cancer screening. Declines pap, will defer to next year, history of normal Pap's in the past. Mammogram UTD. Bone Density testing due and pending. Labs with hypothyroidism, levothyroxine adjusted accordingly, otherwise  unremarkable. Continue with Vitamin D 1000 units daily for deficiency. ECG today unremarkable. Discussed the importance of a healthy diet and regular exercise in order for weight loss and to reduce risk of other medical diseases.    Next appointment: Follow up in 1 year for Initial Wellness Visit.

## 2015-09-09 ENCOUNTER — Other Ambulatory Visit: Payer: Self-pay | Admitting: Primary Care

## 2015-09-09 ENCOUNTER — Other Ambulatory Visit: Payer: Self-pay | Admitting: Nurse Practitioner

## 2015-09-09 DIAGNOSIS — E785 Hyperlipidemia, unspecified: Secondary | ICD-10-CM

## 2015-09-09 DIAGNOSIS — K219 Gastro-esophageal reflux disease without esophagitis: Secondary | ICD-10-CM

## 2015-09-09 NOTE — Telephone Encounter (Signed)
Ok to refill? Electronically refill request for   fenofibrate micronized (LOFIBRA) 134 MG capsule  omeprazole (PRILOSEC) 20 MG capsule  Medications have not been prescribed by Anda Kraft. Last seen on 09/05/2015. Lab appt on 11/07/2015.

## 2015-09-16 ENCOUNTER — Encounter: Payer: Self-pay | Admitting: Primary Care

## 2015-09-24 LAB — COLOGUARD: COLOGUARD: POSITIVE

## 2015-09-26 ENCOUNTER — Encounter: Payer: Self-pay | Admitting: Primary Care

## 2015-09-26 ENCOUNTER — Telehealth: Payer: Self-pay | Admitting: Primary Care

## 2015-09-26 DIAGNOSIS — Z1211 Encounter for screening for malignant neoplasm of colon: Secondary | ICD-10-CM

## 2015-09-26 NOTE — Telephone Encounter (Signed)
Please notify patient that her Cologuard specimen was positive. She will need a colonoscopy for further evaluation. I have placed a referral to someone should contact her soon regarding her appointment.

## 2015-09-29 NOTE — Telephone Encounter (Signed)
Spoken and notified patient of Kate's comments. Patient verbalized understanding. 

## 2015-10-30 ENCOUNTER — Other Ambulatory Visit: Payer: Self-pay | Admitting: Primary Care

## 2015-10-30 DIAGNOSIS — E039 Hypothyroidism, unspecified: Secondary | ICD-10-CM

## 2015-11-05 ENCOUNTER — Other Ambulatory Visit (INDEPENDENT_AMBULATORY_CARE_PROVIDER_SITE_OTHER): Payer: Medicare Other

## 2015-11-05 ENCOUNTER — Ambulatory Visit (INDEPENDENT_AMBULATORY_CARE_PROVIDER_SITE_OTHER): Payer: Medicare Other

## 2015-11-05 DIAGNOSIS — Z23 Encounter for immunization: Secondary | ICD-10-CM | POA: Diagnosis not present

## 2015-11-05 DIAGNOSIS — E039 Hypothyroidism, unspecified: Secondary | ICD-10-CM

## 2015-11-05 LAB — TSH: TSH: 1.95 u[IU]/mL (ref 0.35–4.50)

## 2015-11-28 ENCOUNTER — Other Ambulatory Visit: Payer: Self-pay | Admitting: Primary Care

## 2015-11-28 DIAGNOSIS — E039 Hypothyroidism, unspecified: Secondary | ICD-10-CM

## 2015-11-28 DIAGNOSIS — K219 Gastro-esophageal reflux disease without esophagitis: Secondary | ICD-10-CM

## 2015-12-18 ENCOUNTER — Telehealth: Payer: Self-pay | Admitting: Primary Care

## 2015-12-18 ENCOUNTER — Other Ambulatory Visit: Payer: Self-pay | Admitting: Primary Care

## 2015-12-18 DIAGNOSIS — F418 Other specified anxiety disorders: Secondary | ICD-10-CM

## 2015-12-18 NOTE — Telephone Encounter (Signed)
Please notify patient that use of Alprazolam on a daily basis is unsafe and ineffective treatment. I really want to provide her with the best care. She needs to be on a daily medication to help with daily anxiety as she seems to be going through a lot. I would be open to supplying her with a short term amount of Alprazolam if she agreed to start proper treatment on a daily medication. I would like to see her in the office to discuss this further. Please schedule.

## 2015-12-18 NOTE — Telephone Encounter (Signed)
Message left for patient to return my call.  

## 2015-12-18 NOTE — Telephone Encounter (Signed)
Spoken and notified patient of Kate's comments. Patient verbalized understanding.  Patient would like a refill of the alprazolam until appointment. Patient has appointment on 02/10/2016.

## 2015-12-19 ENCOUNTER — Encounter: Payer: Self-pay | Admitting: Primary Care

## 2015-12-19 MED ORDER — ALPRAZOLAM 0.25 MG PO TABS: ORAL_TABLET | ORAL | 0 refills | Status: AC

## 2015-12-19 NOTE — Telephone Encounter (Signed)
Called in medication to the pharmacy as instructed. 

## 2015-12-19 NOTE — Telephone Encounter (Signed)
Message left for patient to return my call.  

## 2015-12-19 NOTE — Telephone Encounter (Signed)
Will provide patient with 10 tablets of Alprazolam for her to use only as needed for panic attacks. Please call in Alprazolam 0.25 mg tablets. Take 1 tablet by mouth once daily as needed for panic attacks. #10, no refills. This is to be used sparingly and should last her 1 month.

## 2016-01-23 ENCOUNTER — Encounter: Payer: Self-pay | Admitting: Primary Care

## 2016-02-10 ENCOUNTER — Ambulatory Visit: Payer: Medicare Other | Admitting: Primary Care

## 2016-02-17 ENCOUNTER — Other Ambulatory Visit: Payer: Self-pay | Admitting: Primary Care

## 2016-02-17 ENCOUNTER — Encounter: Payer: Self-pay | Admitting: Primary Care

## 2016-02-17 DIAGNOSIS — E785 Hyperlipidemia, unspecified: Secondary | ICD-10-CM

## 2016-02-17 DIAGNOSIS — K219 Gastro-esophageal reflux disease without esophagitis: Secondary | ICD-10-CM

## 2016-02-17 MED ORDER — PRAVASTATIN SODIUM 40 MG PO TABS: 40.0000 mg | ORAL_TABLET | Freq: Every day | ORAL | 1 refills | Status: DC

## 2016-02-17 MED ORDER — OMEPRAZOLE 20 MG PO CPDR: DELAYED_RELEASE_CAPSULE | ORAL | 1 refills | Status: DC

## 2016-02-24 ENCOUNTER — Ambulatory Visit: Payer: Self-pay | Admitting: Primary Care

## 2016-05-16 ENCOUNTER — Other Ambulatory Visit: Payer: Self-pay | Admitting: Primary Care

## 2016-05-16 DIAGNOSIS — E039 Hypothyroidism, unspecified: Secondary | ICD-10-CM

## 2016-07-01 ENCOUNTER — Encounter: Payer: Self-pay | Admitting: Primary Care

## 2016-07-02 NOTE — Telephone Encounter (Signed)
I have already cancelled the appointments.

## 2016-08-13 ENCOUNTER — Other Ambulatory Visit: Payer: Self-pay | Admitting: Primary Care

## 2016-08-13 DIAGNOSIS — E039 Hypothyroidism, unspecified: Secondary | ICD-10-CM

## 2016-09-01 ENCOUNTER — Ambulatory Visit: Payer: Medicare Other

## 2016-09-01 ENCOUNTER — Other Ambulatory Visit: Payer: Medicare Other

## 2016-09-08 ENCOUNTER — Encounter: Payer: Medicare Other | Admitting: Primary Care

## 2017-02-01 ENCOUNTER — Other Ambulatory Visit: Payer: Self-pay | Admitting: Internal Medicine

## 2017-02-01 DIAGNOSIS — Z1231 Encounter for screening mammogram for malignant neoplasm of breast: Secondary | ICD-10-CM

## 2017-02-15 ENCOUNTER — Ambulatory Visit
Admission: RE | Admit: 2017-02-15 | Discharge: 2017-02-15 | Disposition: A | Discharge status: Home / Self Care | Payer: Medicare Other | Source: Ambulatory Visit | Attending: Internal Medicine | Admitting: Internal Medicine

## 2017-02-15 DIAGNOSIS — Z1231 Encounter for screening mammogram for malignant neoplasm of breast: Secondary | ICD-10-CM

## 2017-02-15 HISTORY — DX: Malignant (primary) neoplasm, unspecified: C80.1

## 2017-06-20 ENCOUNTER — Other Ambulatory Visit: Payer: Self-pay | Admitting: Family Medicine

## 2017-06-20 DIAGNOSIS — E2839 Other primary ovarian failure: Secondary | ICD-10-CM

## 2017-09-06 ENCOUNTER — Other Ambulatory Visit
Admission: RE | Admit: 2017-09-06 | Discharge: 2017-09-06 | Disposition: A | Discharge status: Home / Self Care | Payer: Medicare Other | Source: Ambulatory Visit | Attending: Student | Admitting: Student

## 2017-09-06 DIAGNOSIS — R197 Diarrhea, unspecified: Secondary | ICD-10-CM | POA: Diagnosis present

## 2017-09-06 DIAGNOSIS — R109 Unspecified abdominal pain: Secondary | ICD-10-CM | POA: Insufficient documentation

## 2017-09-06 LAB — GASTROINTESTINAL PANEL BY PCR, STOOL (REPLACES STOOL CULTURE)
ADENOVIRUS F40/41: NOT DETECTED
ASTROVIRUS: NOT DETECTED
CYCLOSPORA CAYETANENSIS: NOT DETECTED
Campylobacter species: NOT DETECTED
Cryptosporidium: NOT DETECTED
ENTEROAGGREGATIVE E COLI (EAEC): NOT DETECTED
ENTEROPATHOGENIC E COLI (EPEC): NOT DETECTED
ENTEROTOXIGENIC E COLI (ETEC): NOT DETECTED
Entamoeba histolytica: NOT DETECTED
GIARDIA LAMBLIA: NOT DETECTED
Norovirus GI/GII: NOT DETECTED
Plesimonas shigelloides: NOT DETECTED
Rotavirus A: NOT DETECTED
Salmonella species: NOT DETECTED
Sapovirus (I, II, IV, and V): NOT DETECTED
Shiga like toxin producing E coli (STEC): NOT DETECTED
Shigella/Enteroinvasive E coli (EIEC): NOT DETECTED
VIBRIO SPECIES: NOT DETECTED
Vibrio cholerae: NOT DETECTED
Yersinia enterocolitica: NOT DETECTED

## 2017-09-08 LAB — CALPROTECTIN, FECAL: Calprotectin, Fecal: 90 ug/g (ref 0–120)

## 2017-09-09 ENCOUNTER — Other Ambulatory Visit: Payer: Self-pay

## 2017-09-09 ENCOUNTER — Emergency Department
Admission: EM | Admit: 2017-09-09 | Discharge: 2017-09-09 | Disposition: A | Discharge status: Home / Self Care | Payer: Medicare Other | Attending: Emergency Medicine | Admitting: Emergency Medicine

## 2017-09-09 ENCOUNTER — Encounter: Payer: Self-pay | Admitting: Emergency Medicine

## 2017-09-09 ENCOUNTER — Emergency Department: Payer: Medicare Other

## 2017-09-09 DIAGNOSIS — J181 Lobar pneumonia, unspecified organism: Secondary | ICD-10-CM | POA: Diagnosis not present

## 2017-09-09 DIAGNOSIS — E039 Hypothyroidism, unspecified: Secondary | ICD-10-CM | POA: Diagnosis not present

## 2017-09-09 DIAGNOSIS — I10 Essential (primary) hypertension: Secondary | ICD-10-CM | POA: Insufficient documentation

## 2017-09-09 DIAGNOSIS — Z79899 Other long term (current) drug therapy: Secondary | ICD-10-CM | POA: Insufficient documentation

## 2017-09-09 DIAGNOSIS — R05 Cough: Secondary | ICD-10-CM | POA: Diagnosis present

## 2017-09-09 DIAGNOSIS — Z87891 Personal history of nicotine dependence: Secondary | ICD-10-CM | POA: Diagnosis not present

## 2017-09-09 DIAGNOSIS — J189 Pneumonia, unspecified organism: Secondary | ICD-10-CM

## 2017-09-09 MED ORDER — GUAIFENESIN-CODEINE 100-10 MG/5ML PO SOLN: 5.0000 mL | Freq: Four times a day (QID) | ORAL | 0 refills | Status: DC | PRN

## 2017-09-09 MED ORDER — ALBUTEROL SULFATE HFA 108 (90 BASE) MCG/ACT IN AERS: 2.0000 | INHALATION_SPRAY | Freq: Four times a day (QID) | RESPIRATORY_TRACT | 2 refills | Status: DC | PRN

## 2017-09-09 MED ORDER — LEVOFLOXACIN 750 MG PO TABS: 750.0000 mg | ORAL_TABLET | Freq: Every day | ORAL | 0 refills | Status: AC

## 2017-09-09 MED ORDER — IPRATROPIUM-ALBUTEROL 0.5-2.5 (3) MG/3ML IN SOLN
3.0000 mL | Freq: Once | RESPIRATORY_TRACT | Status: AC
  Administered 2017-09-09: 3 mL via RESPIRATORY_TRACT
  Filled 2017-09-09: qty 3

## 2017-09-09 NOTE — ED Triage Notes (Signed)
Here for cough and sore throat since Monday.  Low grade temp in triage. Ambulatory. Mildly tachypenic but unlabored and without retractions.  Mild coarse crackle right base.

## 2017-09-09 NOTE — ED Notes (Signed)
Pt ambulatory to room without difficulties. Right lower lung sounds diminished.

## 2017-09-09 NOTE — ED Triage Notes (Signed)
First Nurse Note:  [facility] from [facility] for evaluation for pneumonia.  Patient states she has a fever and oxygen sats were 92% at the  [facility].  Patient is AAOx3.  Skin warm and dry. NAD.  No SOB/ DOE.  NAD

## 2017-09-09 NOTE — ED Provider Notes (Signed)
[facility] Emergency Department Provider Note   ____________________________________________   First MD Initiated Contact with Patient 09/09/17 1150     (approximate)  I have reviewed the triage vital signs and the nursing notes.   HISTORY  Chief Complaint Cough   HPI Darlene Mcdowell is a 67 y.o. female presents to the emergency department after being seen at [facility]".  Patient states that they were concerned that she does have pneumonia.  Patient states that she has had a low-grade temp, cough and sore throat for the last 5 days.  Patient is a non-smoker.  She states that over-the-counter medication has not helped with her cough.  She denies any previous history of bronchitis or pneumonia.  Patient continues to eat and drink at home without any difficulties.  She states the cough is keeping her up at night.  Currently she rates her pain as 3/10.   Past Medical History:  Diagnosis Date  . Anxiety   . Cancer ([place])    skin  . GERD (gastroesophageal reflux disease)   . Hyperlipidemia   . Hypertension   . Thyroid disease    under active thyroid problems  . Vitamin D deficiency     Patient Active Problem List   Diagnosis Date Noted  . Essential hypertension 06/03/2015  . Vitamin D deficiency 02/11/2015  . Overweight (BMI 25.0-29.9) 10/11/2014  . Left upper arm pain 10/11/2014  . Incontinence of urine 07/21/2014  . Hypothyroidism 07/09/2014  . Welcome to Medicare preventive visit 01/01/2014  . Petechiae 01/01/2014  . Generalized anxiety disorder 01/01/2014  . Hyperlipidemia 01/01/2014    Past Surgical History:  Procedure Laterality Date  . SPINE SURGERY  01/12/2003   cervical c-7  . TUBAL LIGATION  1981    Prior to Admission medications   Medication Sig Start Date End Date Taking? Authorizing Provider  albuterol (PROVENTIL HFA;VENTOLIN HFA) 108 (90 Base) MCG/ACT inhaler Inhale 2 puffs into the lungs every 6 (six) hours as needed for  wheezing or shortness of breath. 09/09/17   Johnn Hai, PA-C  ALPRAZolam Duanne Moron) 0.25 MG tablet Take 1 tablet by mouth once daily as needed for panic attacks 12/19/15   Pleas Koch, NP  Coenzyme Q10 (COQ10 PO) Take 300 mg by mouth at bedtime.    [provider]  fenofibrate micronized (LOFIBRA) 134 MG capsule  04/16/15   [provider]  fenofibrate micronized (LOFIBRA) 134 MG capsule TAKE 1 CAPSULE(134 MG) BY MOUTH AT BEDTIME 09/09/15   Pleas Koch, NP  guaiFENesin-codeine 100-10 MG/5ML syrup Take 5 mLs by mouth every 6 (six) hours as needed for cough. 09/09/17   Johnn Hai, PA-C  levofloxacin (LEVAQUIN) 750 MG tablet Take 1 tablet (750 mg total) by mouth daily for 10 days. 09/09/17 09/19/17  Johnn Hai, PA-C  levothyroxine (SYNTHROID, LEVOTHROID) 100 MCG tablet TAKE 1 TABLET BY MOUTH EVERY DAY 08/13/16   Pleas Koch, NP  omeprazole (PRILOSEC) 20 MG capsule TAKE 1 CAPSULE BY MOUTH EVERY DAY 02/17/16   Pleas Koch, NP  pravastatin (PRAVACHOL) 40 MG tablet Take 1 tablet (40 mg total) by mouth daily. 02/17/16   Pleas Koch, NP    Allergies Patient has no known allergies.  Family History  Problem Relation Age of Onset  . Stroke Mother   . Hypertension Mother   . Hyperlipidemia Father   . Heart disease Father   . Hypertension Father   . Cancer Maternal Grandmother   .  Heart disease Maternal Grandfather   . Heart disease Paternal Grandmother   . Breast cancer Neg Hx     Social History Social History   Tobacco Use  . Smoking status: Former Smoker    Last attempt to quit: 01/12/2003    Years since quitting: 14.6  Substance Use Topics  . Alcohol use: Yes    Alcohol/week: 0.0 standard drinks    Comment: Occasional glass of wine  . Drug use: No    Review of Systems Constitutional: Positive subjective fever/chills. Eyes: No visual changes. ENT: No sore throat. Cardiovascular: Denies chest pain. Respiratory: Positive  shortness of breath with exertion, positive productive cough. Gastrointestinal: No abdominal pain.  No nausea, no vomiting.   Musculoskeletal: Negative for back pain. Skin: Negative for rash. Neurological: Negative for headaches, focal weakness or numbness. ____________________________________________   PHYSICAL EXAM:  VITAL SIGNS: ED Triage Vitals  Enc Vitals Group     BP 09/09/17 1145 115/74     Pulse Rate 09/09/17 1145 80     Resp 09/09/17 1145 (!) 25     Temp 09/09/17 1145 100 F (37.8 C)     Temp Source 09/09/17 1145 Oral     SpO2 09/09/17 1145 94 %     Weight 09/09/17 1143 188 lb (85.3 kg)     Height 09/09/17 1143 5\' 8"  (1.727 m)     Head Circumference --      Peak Flow --      Pain Score 09/09/17 1142 3     Pain Loc --      Pain Edu? --      Excl. in [place]? --    Constitutional: Alert and oriented. Well appearing and in no acute distress. Eyes: Conjunctivae are normal.  Head: Atraumatic. Nose: No congestion/rhinnorhea. Mouth/Throat: Mucous membranes are moist.  Oropharynx non-erythematous. Neck: No stridor.   Hematological/Lymphatic/Immunilogical: No cervical lymphadenopathy. Cardiovascular: Normal rate, regular rhythm. Grossly normal heart sounds.  Good peripheral circulation. Respiratory: Normal respiratory effort.  No retractions. Lungs with coarse cough and rales noted especially on the right base.  Patient is able to speak in complete sentences without any difficulty.  Patient without wheezing.  Cough in the ED appears to be nonproductive. Gastrointestinal: Soft and nontender. No distention.  Musculoskeletal: Moves upper and lower extremities with any difficulty normal gait was noted. Neurologic:  Normal speech and language. No gross focal neurologic deficits are appreciated. No gait instability. Skin:  Skin is warm, dry and intact. No rash noted. Psychiatric: Mood and affect are normal. Speech and behavior are  normal.  ____________________________________________   LABS (all labs ordered are listed, but only abnormal results are displayed)  Labs Reviewed - No data to display  RADIOLOGY  ED MD interpretation:   Chest x-ray shows right lower lobe pneumonia.  Official radiology report(s): Dg Chest 2 View  Result Date: 09/09/2017 CLINICAL DATA:  Shortness of breath and productive cough EXAM: CHEST - 2 VIEW COMPARISON:  None. FINDINGS: Cardiac shadows within normal limits. Aortic calcifications are noted. Patchy infiltrate is noted the right lung base projecting in the middle and lower lobe. No sizable effusion is seen. No bony abnormality is noted. IMPRESSION: Right basilar infiltrate. Followup PA and lateral chest X-ray is recommended in 3-4 weeks following trial of antibiotic therapy to ensure resolution and exclude underlying malignancy. Aortic Atherosclerosis (ICD10-I70.0). Electronically Signed   By: Inez Catalina M.D.   On: 09/09/2017 12:30    ____________________________________________   PROCEDURES  Procedure(s) performed: None  Procedures  Critical Care performed: No  ____________________________________________   INITIAL IMPRESSION / ASSESSMENT AND PLAN / ED COURSE  As part of my medical decision making, I reviewed the following data within the electronic MEDICAL RECORD NUMBER Notes from prior ED visits and [place] Controlled Substance Database  Patient presents to the emergency department with complaint of cough x1 week.  Patient has been using over-the-counter medication without any relief.  Chest x-ray revealed right lower lobe pneumonia.  She is encouraged to follow-up with her PCP in 4 weeks for reevaluation.  At this time she was given prescription for Robitussin-AC every 6 hours as needed for coughing, Levaquin 750 mg 1 daily for 10 days, and albuterol inhaler every 6 hours as needed for wheezing and cough.  Patient was given a DuoNeb treatment while in the emergency department  which helped with her cough.  Patient was amatory while in the department without any difficulties.  ____________________________________________   FINAL CLINICAL IMPRESSION(S) / ED DIAGNOSES  Final diagnoses:  Pneumonia of right lower lobe due to infectious organism [facility])     ED Discharge Orders         Ordered    guaiFENesin-codeine 100-10 MG/5ML syrup  Every 6 hours PRN     09/09/17 1241    levofloxacin (LEVAQUIN) 750 MG tablet  Daily     09/09/17 1241    albuterol (PROVENTIL HFA;VENTOLIN HFA) 108 (90 Base) MCG/ACT inhaler  Every 6 hours PRN     09/09/17 1241           Note:  This document was prepared using Dragon voice recognition software and may include unintentional dictation errors.    Johnn Hai, PA-C 09/09/17 1410    Earleen Newport, MD 09/09/17 [phone]

## 2017-09-09 NOTE — ED Notes (Signed)
Pt states she feels better after tx. Breath sounds clear in upper lobes, mild wheezes in left lower lobe.

## 2017-09-09 NOTE — Discharge Instructions (Signed)
Follow-up with your primary care provider.  Schedule an appointment for 3 to 4 weeks for reevaluation.  Begin taking antibiotics as directed 1 tablet once a day for the next 10 days.  Guaifenesin with codeine is every 6 hours if you do not plan to leave the home or drive.  This does contain a narcotic and can cause drowsiness.  Also albuterol inhaler 2 puffs every 6 hours if needed for shortness of breath and wheezing.  Increase fluids.  Tylenol or ibuprofen if needed for fever or body aches.  Return to the emergency department over the weekend if any severe worsening of your symptoms.

## 2017-09-19 IMAGING — MG MM DIGITAL SCREENING BILAT W/ TOMO W/ CAD
8 of 12 series · 8 of 28 positions shown · non-contrast
Comparison: Previous exam(s).

CLINICAL DATA: Screening.

EXAM:
2D DIGITAL SCREENING BILATERAL MAMMOGRAM WITH CAD AND ADJUNCT TOMO

[R MLO synth-2D]
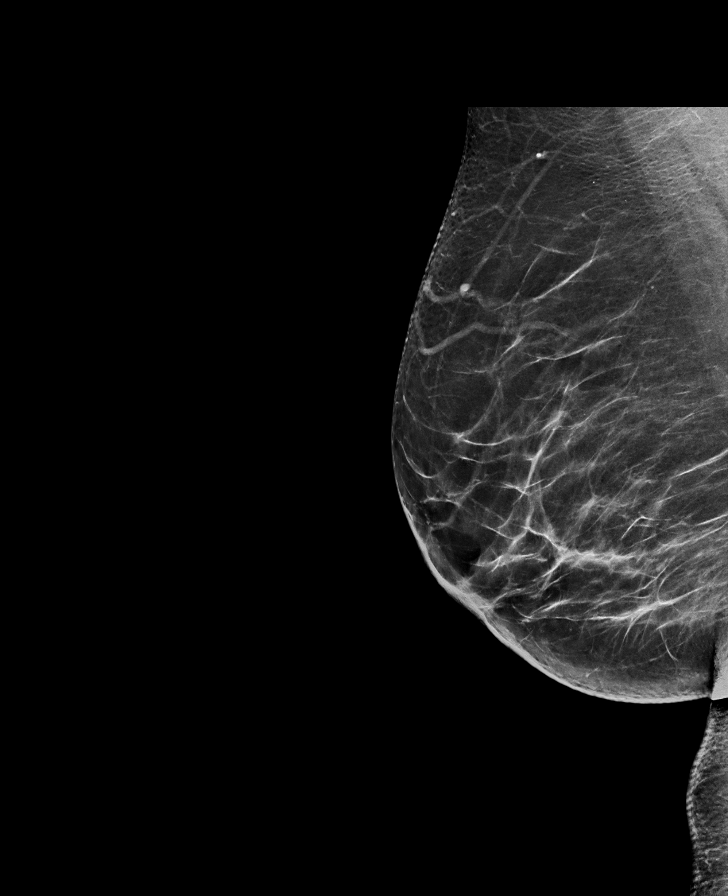

[L MLO]
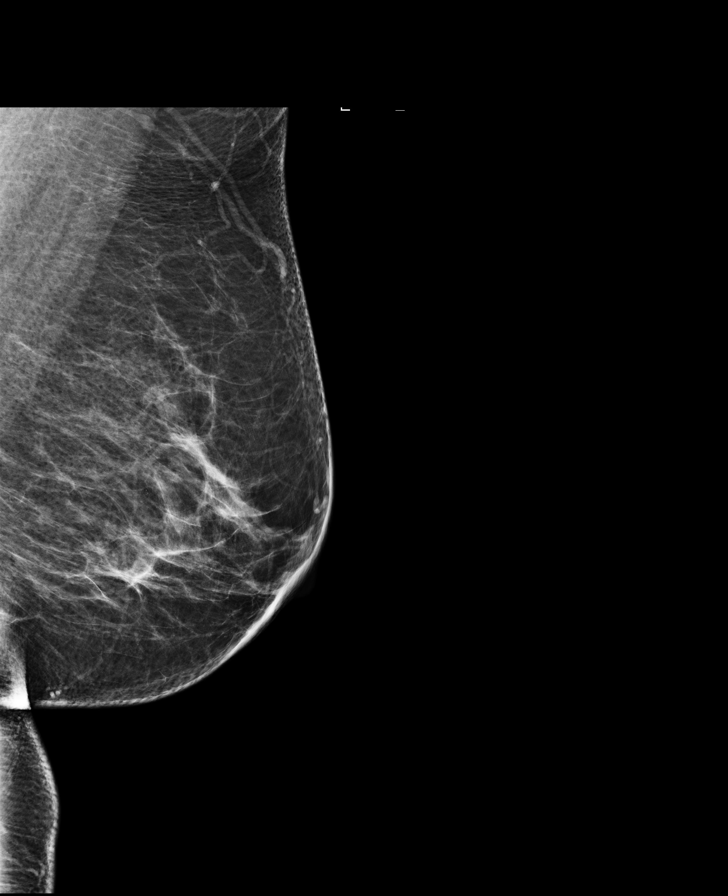

[L MLO synth-2D]
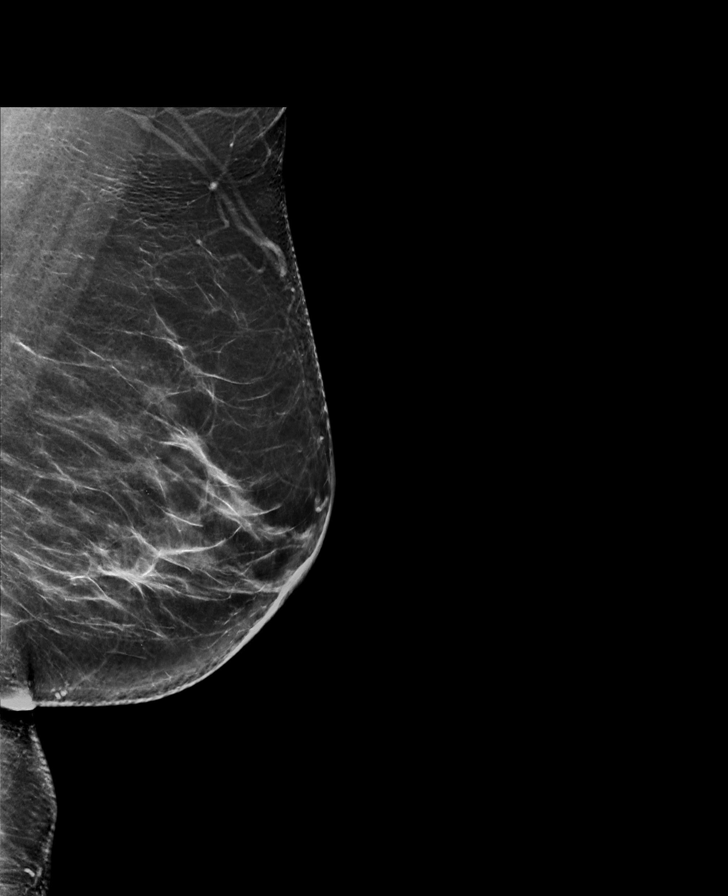

[R CC synth-2D]
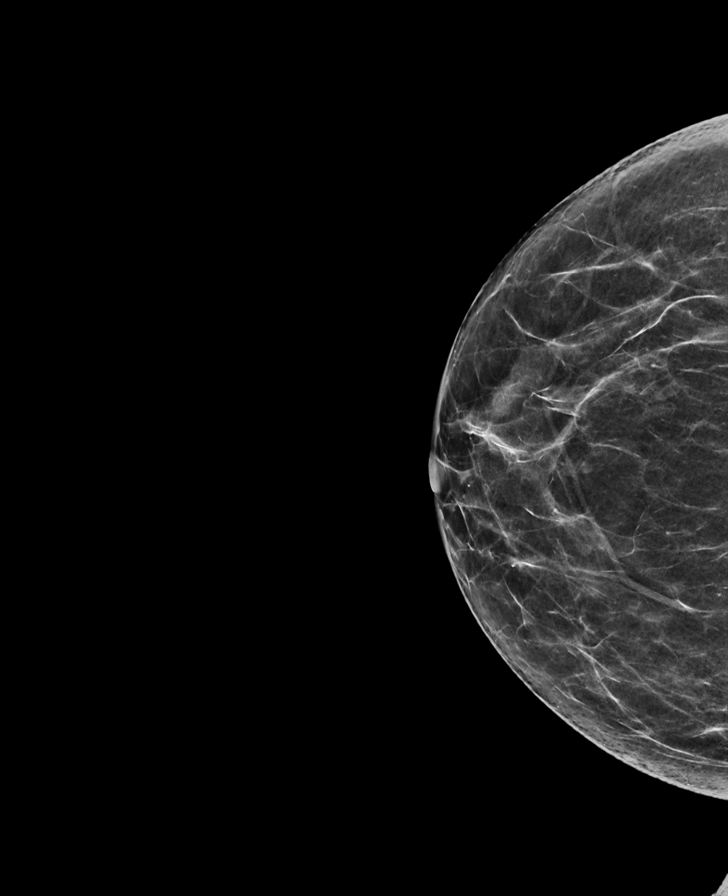

[L CC]
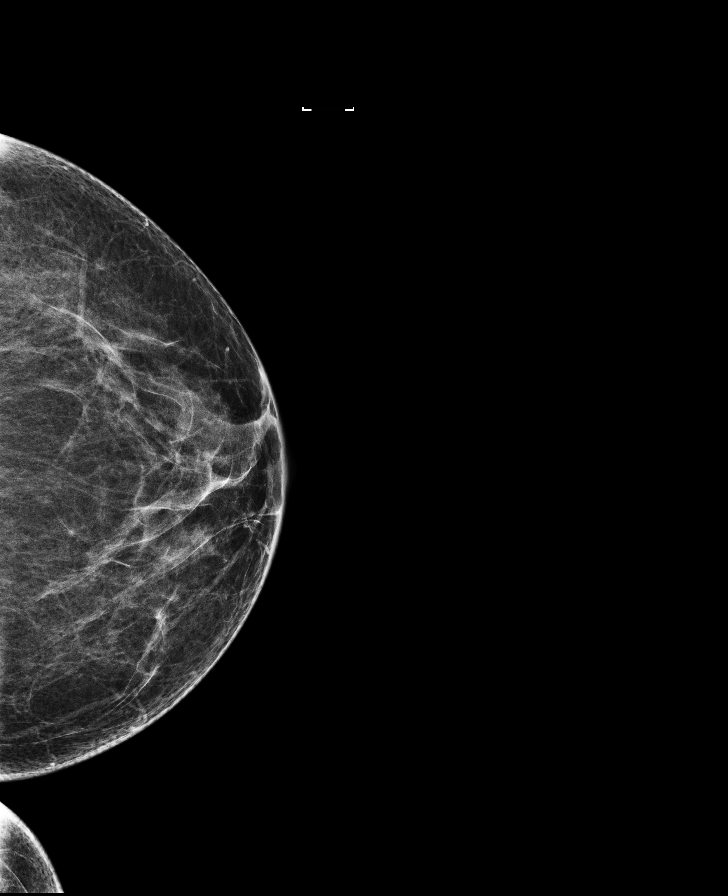

[R CC]
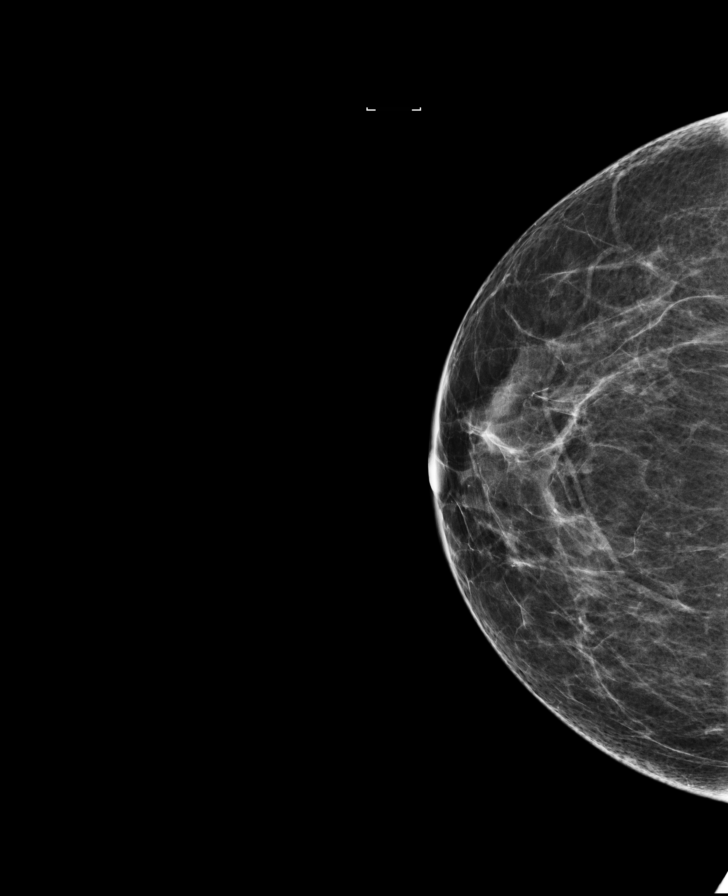

[R MLO]
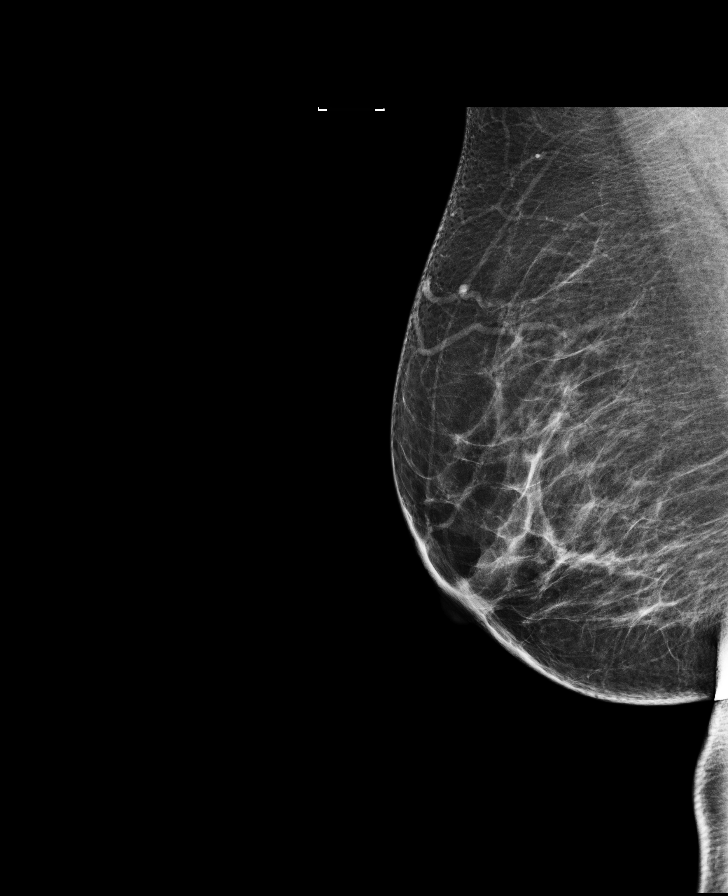

[L CC synth-2D]
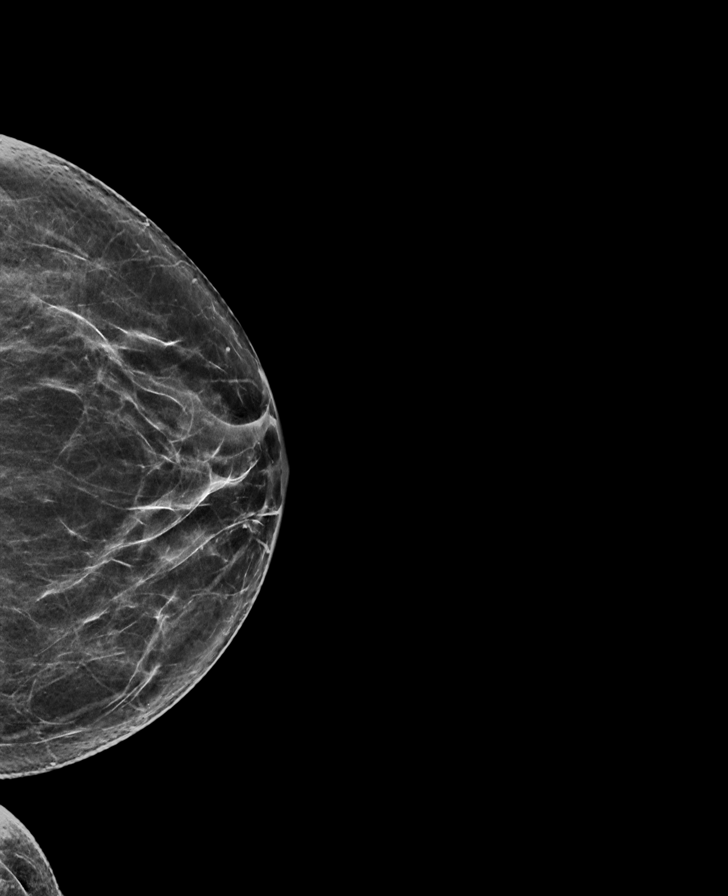

[8 of 28 positions shown; findings below may reference images not displayed]

ACR Breast Density Category b: There are scattered areas of
fibroglandular density.
FINDINGS: There are no findings suspicious for malignancy. Images were
processed with CAD.
IMPRESSION: No mammographic evidence of malignancy. A result letter of this
screening mammogram will be mailed directly to the patient.

RECOMMENDATION:
Screening mammogram in one year. (Code:97-6-RS4)

BI-RADS CATEGORY  1: Negative.

## 2019-01-12 HISTORY — PX: LUMBAR LAMINECTOMY: SHX95

## 2019-02-11 ENCOUNTER — Ambulatory Visit: Payer: Medicare Other

## 2019-02-17 ENCOUNTER — Ambulatory Visit: Payer: Medicare Other

## 2019-03-04 ENCOUNTER — Ambulatory Visit: Payer: Medicare Other

## 2019-07-02 ENCOUNTER — Other Ambulatory Visit: Payer: Self-pay | Admitting: Physician Assistant

## 2019-07-02 DIAGNOSIS — E2839 Other primary ovarian failure: Secondary | ICD-10-CM

## 2019-07-02 DIAGNOSIS — Z1231 Encounter for screening mammogram for malignant neoplasm of breast: Secondary | ICD-10-CM

## 2019-07-17 ENCOUNTER — Ambulatory Visit
Admission: RE | Admit: 2019-07-17 | Discharge: 2019-07-17 | Disposition: A | Discharge status: Home / Self Care | Payer: Medicare Other | Source: Ambulatory Visit | Attending: Physician Assistant | Admitting: Physician Assistant

## 2019-07-17 DIAGNOSIS — Z1231 Encounter for screening mammogram for malignant neoplasm of breast: Secondary | ICD-10-CM

## 2019-07-17 DIAGNOSIS — E2839 Other primary ovarian failure: Secondary | ICD-10-CM | POA: Insufficient documentation

## 2019-07-18 ENCOUNTER — Other Ambulatory Visit: Payer: Self-pay | Admitting: Physician Assistant

## 2019-07-23 ENCOUNTER — Other Ambulatory Visit: Payer: Self-pay | Admitting: Physician Assistant

## 2019-07-23 DIAGNOSIS — N6320 Unspecified lump in the left breast, unspecified quadrant: Secondary | ICD-10-CM

## 2019-07-23 DIAGNOSIS — R928 Other abnormal and inconclusive findings on diagnostic imaging of breast: Secondary | ICD-10-CM

## 2019-07-26 ENCOUNTER — Ambulatory Visit
Admission: RE | Admit: 2019-07-26 | Discharge: 2019-07-26 | Disposition: A | Discharge status: Home / Self Care | Payer: Medicare Other | Source: Ambulatory Visit | Attending: Physician Assistant | Admitting: Physician Assistant

## 2019-07-26 DIAGNOSIS — R928 Other abnormal and inconclusive findings on diagnostic imaging of breast: Secondary | ICD-10-CM | POA: Diagnosis not present

## 2019-07-26 DIAGNOSIS — N6320 Unspecified lump in the left breast, unspecified quadrant: Secondary | ICD-10-CM

## 2021-01-31 ENCOUNTER — Emergency Department: Payer: Medicare Other

## 2021-01-31 ENCOUNTER — Encounter: Payer: Self-pay | Admitting: Emergency Medicine

## 2021-01-31 ENCOUNTER — Other Ambulatory Visit: Payer: Self-pay

## 2021-01-31 ENCOUNTER — Emergency Department
Admission: EM | Admit: 2021-01-31 | Discharge: 2021-01-31 | Disposition: A | Discharge status: Home / Self Care | Payer: Medicare Other | Attending: Emergency Medicine | Admitting: Emergency Medicine

## 2021-01-31 DIAGNOSIS — W1830XA Fall on same level, unspecified, initial encounter: Secondary | ICD-10-CM | POA: Diagnosis not present

## 2021-01-31 DIAGNOSIS — S7001XA Contusion of right hip, initial encounter: Secondary | ICD-10-CM | POA: Insufficient documentation

## 2021-01-31 DIAGNOSIS — S62112A Displaced fracture of triquetrum [cuneiform] bone, left wrist, initial encounter for closed fracture: Secondary | ICD-10-CM | POA: Insufficient documentation

## 2021-01-31 DIAGNOSIS — M25532 Pain in left wrist: Secondary | ICD-10-CM | POA: Insufficient documentation

## 2021-01-31 DIAGNOSIS — S6992XA Unspecified injury of left wrist, hand and finger(s), initial encounter: Secondary | ICD-10-CM | POA: Diagnosis present

## 2021-01-31 DIAGNOSIS — W19XXXA Unspecified fall, initial encounter: Secondary | ICD-10-CM

## 2021-01-31 MED ORDER — OXYCODONE-ACETAMINOPHEN 5-325 MG PO TABS: 1.0000 | ORAL_TABLET | Freq: Four times a day (QID) | ORAL | 0 refills | Status: DC | PRN

## 2021-01-31 MED ORDER — OXYCODONE-ACETAMINOPHEN 5-325 MG PO TABS
1.0000 | ORAL_TABLET | Freq: Once | ORAL | Status: AC
  Administered 2021-01-31: 1 via ORAL
  Filled 2021-01-31: qty 1

## 2021-01-31 MED ORDER — ONDANSETRON 4 MG PO TBDP
4.0000 mg | ORAL_TABLET | Freq: Once | ORAL | Status: AC
  Administered 2021-01-31: 4 mg via ORAL
  Filled 2021-01-31: qty 1

## 2021-01-31 MED ORDER — ACETAMINOPHEN 500 MG PO TABS
1000.0000 mg | ORAL_TABLET | Freq: Once | ORAL | Status: AC
  Administered 2021-01-31: 1000 mg via ORAL
  Filled 2021-01-31: qty 2

## 2021-01-31 MED ORDER — METHOCARBAMOL 500 MG PO TABS: 500.0000 mg | ORAL_TABLET | Freq: Four times a day (QID) | ORAL | 0 refills | Status: DC

## 2021-01-31 NOTE — ED Triage Notes (Signed)
Pt in via POV, reports mechanical fall while going to [employer] today, landing on right side.  Complaints of right hip pain, left wrist pain, and right upper arm pain.  Swelling noted to left wrist, bulging,  hematoma like area to right hip.

## 2021-01-31 NOTE — ED Notes (Signed)
This RN assisted patient to bathroom; ambulatory with standby assist.

## 2021-01-31 NOTE — ED Provider Notes (Signed)
[facility] Provider Note  Patient Contact: 10:43 PM (approximate)   History   Fall   HPI  Darlene Mcdowell is a 71 y.o. female who presents the emergency department complaining of right hip and left wrist pain.  Patient states that she sustained a mechanical fall while going to the [employer] store.  Patient states that the stent was a little bit higher than she expected and she has had some issues with her right leg from chronic sciatica.  Patient states that she misjudged and caught her foot causing her to fall forward.  Patient landed trying to catch herself with an outstretched left hand but landed primarily on her right hip.  She has pain to the hip, pain to the wrist.  Able to move both at this time.     Physical Exam   Triage Vital Signs: ED Triage Vitals  Enc Vitals Group     BP 01/31/21 2020 129/82     Pulse Rate 01/31/21 2020 91     Resp 01/31/21 2020 19     Temp 01/31/21 2020 98.1 F (36.7 C)     Temp Source 01/31/21 2020 Oral     SpO2 01/31/21 2020 96 %     Weight 01/31/21 1618 170 lb (77.1 kg)     Height 01/31/21 1618 5\' 8"  (1.727 m)     Head Circumference --      Peak Flow --      Pain Score 01/31/21 1617 8     Pain Loc --      Pain Edu? --      Excl. in [place]? --     Most recent vital signs: Vitals:   01/31/21 2020 01/31/21 2233  BP: 129/82 138/73  Pulse: 91   Resp: 19 17  Temp: 98.1 F (36.7 C) 98.1 F (36.7 C)  SpO2: 96%      General: Alert and in no acute distress.  Head: No acute traumatic findings  Neck: No stridor. No cervical spine tenderness to palpation.  Cardiovascular:  Good peripheral perfusion Respiratory: Normal respiratory effort without tachypnea or retractions. Lungs CTAB. Musculoskeletal: Full range of motion to all extremities.  Patient with what appears to be hematoma to the right hip.  No deformity of the hip, no shortening or rotation of the hip.  Patient is able to ambulate at this time though she does so  with a limp.  Patient with tenderness over this area of hematoma.  No other significant tenderness over the hip joint.  Dorsalis pedis pulses sensation tact distally.  Examination of left wrist reveals some tenderness along the dorsal aspect over the proximal carpal bones.  No palpable abnormality.  No significant areas of tenderness.  Forage motion of all digits distally.  Sensation capillary refill intact. Neurologic:  No gross focal neurologic deficits are appreciated.  Skin:   No rash noted Other:   ED Results / Procedures / Treatments   Labs (all labs ordered are listed, but only abnormal results are displayed) Labs Reviewed - No data to display   EKG     RADIOLOGY  I personally viewed and evaluated these images as part of my medical decision making, as well as reviewing the written report by the radiologist.  ED Provider Interpretation: Visualization of the left wrist reveals what appears to be a triquetral fracture likely an avulsion fracture.  No other significant findings on wrist.  Imaging of the right hip revealed no acute traumatic findings such as  fracture or dislocation.  DG Wrist Complete Left  Result Date: 01/31/2021 CLINICAL DATA:  Fall, pain EXAM: LEFT WRIST - COMPLETE 3+ VIEW COMPARISON:  None. FINDINGS: Suspect a small triquetral avulsion fracture, seen on lateral view only. The carpus is otherwise normally aligned. No other evidence of fracture or dislocation. Soft tissues are unremarkable. IMPRESSION: 1. Suspect a small triquetral avulsion fracture, seen on lateral view only. 2. The carpus is otherwise normally aligned. No other evidence of fracture or dislocation. Electronically Signed   By: Delanna Ahmadi M.D.   On: 01/31/2021 17:25   DG Hip Unilat  With Pelvis 2-3 Views Right  Result Date: 01/31/2021 CLINICAL DATA:  Fall.  Right hip pain.  Rounded EXAM: DG HIP (WITH OR WITHOUT PELVIS) 2-3V RIGHT COMPARISON:  None. FINDINGS: There is no evidence of hip fracture or  dislocation. There is no evidence of arthropathy or other focal bone abnormality. Calcification over the right hemipelvis likely related to uterine fibroid. IMPRESSION: No evidence for an acute fracture involving the bony pelvis or right proximal femur. Electronically Signed   By: Misty Stanley M.D.   On: 01/31/2021 17:26    PROCEDURES:  Critical Care performed: No  Procedures   MEDICATIONS ORDERED IN ED: Medications  acetaminophen (TYLENOL) tablet 1,000 mg (1,000 mg Oral Given 01/31/21 1726)  oxyCODONE-acetaminophen (PERCOCET/ROXICET) 5-325 MG per tablet 1 tablet (1 tablet Oral Given 01/31/21 2249)  ondansetron (ZOFRAN-ODT) disintegrating tablet 4 mg (4 mg Oral Given 01/31/21 2249)     IMPRESSION / MDM / ASSESSMENT AND PLAN / ED COURSE  I reviewed the triage vital signs and the nursing notes.                              Differential diagnosis includes, but is not limited to, wrist fracture, hip fracture, wrist sprain, hip contusion   Patient's diagnosis is consistent with fall, triquetral avulsion fracture of the wrist, hip hematoma.  Patient presented to the emergency department for mechanical fall.  X-rays reveal avulsion fracture off the triquetrum and reassuring findings around the right hip.  Patient will have splint placed to the left wrist.  Patient will have symptom control medication and pain medicine and muscle relaxer at home.  Follow-up with orthopedics as needed.  Return precautions discussed with the patient..  Patient is given ED precautions to return to the ED for any worsening or new symptoms.       FINAL CLINICAL IMPRESSION(S) / ED DIAGNOSES   Final diagnoses:  Fall, initial encounter  Hematoma of right hip, initial encounter  Closed chip fracture of triquetrum of left wrist, initial encounter     Rx / DC Orders   ED Discharge Orders          Ordered    oxyCODONE-acetaminophen (PERCOCET/ROXICET) 5-325 MG tablet  Every 6 hours PRN        01/31/21 2331     methocarbamol (ROBAXIN) 500 MG tablet  4 times daily        01/31/21 2331             Note:  This document was prepared using Dragon voice recognition software and may include unintentional dictation errors.   Brynda Peon 01/31/21 2346    Nena Polio, MD 01/31/21 [phone]

## 2021-02-01 MED ORDER — ONDANSETRON 4 MG PO TBDP: 4.0000 mg | ORAL_TABLET | Freq: Three times a day (TID) | ORAL | 0 refills | Status: DC | PRN

## 2021-03-11 ENCOUNTER — Other Ambulatory Visit: Payer: Self-pay | Admitting: Physician Assistant

## 2021-03-11 DIAGNOSIS — M8580 Other specified disorders of bone density and structure, unspecified site: Secondary | ICD-10-CM

## 2021-03-13 ENCOUNTER — Other Ambulatory Visit: Payer: Self-pay | Admitting: Physician Assistant

## 2021-03-13 DIAGNOSIS — Z1231 Encounter for screening mammogram for malignant neoplasm of breast: Secondary | ICD-10-CM

## 2021-06-23 ENCOUNTER — Encounter: Payer: Self-pay | Admitting: Ophthalmology

## 2021-06-24 NOTE — Discharge Instructions (Signed)

## 2021-06-30 ENCOUNTER — Encounter
Admission: RE | Disposition: A | Discharge status: Home / Self Care | Payer: Self-pay | Source: Home / Self Care | Attending: Ophthalmology

## 2021-06-30 ENCOUNTER — Other Ambulatory Visit: Payer: Self-pay

## 2021-06-30 ENCOUNTER — Ambulatory Visit: Payer: Medicare Other | Admitting: Anesthesiology

## 2021-06-30 ENCOUNTER — Ambulatory Visit
Admission: RE | Admit: 2021-06-30 | Discharge: 2021-06-30 | Disposition: A | Discharge status: Home / Self Care | Payer: Medicare Other | Attending: Ophthalmology | Admitting: Ophthalmology

## 2021-06-30 ENCOUNTER — Encounter: Payer: Self-pay | Admitting: Ophthalmology

## 2021-06-30 DIAGNOSIS — E039 Hypothyroidism, unspecified: Secondary | ICD-10-CM | POA: Diagnosis not present

## 2021-06-30 DIAGNOSIS — H2512 Age-related nuclear cataract, left eye: Secondary | ICD-10-CM | POA: Diagnosis present

## 2021-06-30 DIAGNOSIS — I10 Essential (primary) hypertension: Secondary | ICD-10-CM | POA: Diagnosis not present

## 2021-06-30 DIAGNOSIS — Z87891 Personal history of nicotine dependence: Secondary | ICD-10-CM | POA: Insufficient documentation

## 2021-06-30 DIAGNOSIS — Z79899 Other long term (current) drug therapy: Secondary | ICD-10-CM | POA: Diagnosis not present

## 2021-06-30 DIAGNOSIS — K219 Gastro-esophageal reflux disease without esophagitis: Secondary | ICD-10-CM | POA: Diagnosis not present

## 2021-06-30 HISTORY — DX: Motion sickness, initial encounter: T75.3XXA

## 2021-06-30 HISTORY — DX: Hypothyroidism, unspecified: E03.9

## 2021-06-30 HISTORY — DX: Spinal stenosis, lumbar region without neurogenic claudication: M48.061

## 2021-06-30 HISTORY — DX: Sciatica, right side: M54.31

## 2021-06-30 HISTORY — PX: CATARACT EXTRACTION W/PHACO: SHX586

## 2021-06-30 SURGERY — PHACOEMULSIFICATION, CATARACT, WITH IOL INSERTION
Anesthesia: Topical | Site: Eye | Laterality: Left

## 2021-06-30 MED ORDER — ACETAMINOPHEN 325 MG PO TABS: 325.0000 mg | ORAL_TABLET | Freq: Once | ORAL | Status: DC

## 2021-06-30 MED ORDER — FENTANYL CITRATE (PF) 100 MCG/2ML IJ SOLN
INTRAMUSCULAR | Status: DC | PRN
  Administered 2021-06-30: 50 ug via INTRAVENOUS

## 2021-06-30 MED ORDER — MOXIFLOXACIN HCL 0.5 % OP SOLN
OPHTHALMIC | Status: DC | PRN
  Administered 2021-06-30: 0.2 mL via OPHTHALMIC

## 2021-06-30 MED ORDER — SIGHTPATH DOSE#1 BSS IO SOLN
INTRAOCULAR | Status: DC | PRN
  Administered 2021-06-30: 1 mL via INTRAMUSCULAR

## 2021-06-30 MED ORDER — MIDAZOLAM HCL 2 MG/2ML IJ SOLN
INTRAMUSCULAR | Status: DC | PRN
  Administered 2021-06-30: 1 mg via INTRAVENOUS

## 2021-06-30 MED ORDER — TETRACAINE HCL 0.5 % OP SOLN
1.0000 [drp] | OPHTHALMIC | Status: DC | PRN
  Administered 2021-06-30 (×3): 1 [drp] via OPHTHALMIC

## 2021-06-30 MED ORDER — SIGHTPATH DOSE#1 BSS IO SOLN
INTRAOCULAR | Status: DC | PRN
  Administered 2021-06-30: 15 mL

## 2021-06-30 MED ORDER — ACETAMINOPHEN 160 MG/5ML PO SOLN: 325.0000 mg | Freq: Once | ORAL | Status: DC

## 2021-06-30 MED ORDER — ARMC OPHTHALMIC DILATING DROPS
1.0000 "application " | OPHTHALMIC | Status: DC | PRN
  Administered 2021-06-30 (×3): 1 via OPHTHALMIC

## 2021-06-30 MED ORDER — SIGHTPATH DOSE#1 NA CHONDROIT SULF-NA HYALURON 40-17 MG/ML IO SOLN
INTRAOCULAR | Status: DC | PRN
  Administered 2021-06-30: 1 mL via INTRAOCULAR

## 2021-06-30 MED ORDER — BRIMONIDINE TARTRATE-TIMOLOL 0.2-0.5 % OP SOLN
OPHTHALMIC | Status: DC | PRN
  Administered 2021-06-30: 1 [drp] via OPHTHALMIC

## 2021-06-30 MED ORDER — LACTATED RINGERS IV SOLN: INTRAVENOUS | Status: DC

## 2021-06-30 MED ORDER — SIGHTPATH DOSE#1 BSS IO SOLN
INTRAOCULAR | Status: DC | PRN
  Administered 2021-06-30: 61 mL via OPHTHALMIC

## 2021-06-30 SURGICAL SUPPLY — 10 items
CATARACT SUITE SIGHTPATH (MISCELLANEOUS) ×2 IMPLANT
FEE CATARACT SUITE SIGHTPATH (MISCELLANEOUS) ×1 IMPLANT
GLOVE SURG ENC TEXT LTX SZ8 (GLOVE) ×2 IMPLANT
GLOVE SURG TRIUMPH 8.0 PF LTX (GLOVE) ×2 IMPLANT
LENS IOL TECNIS EYHANCE 21.0 (Intraocular Lens) ×1 IMPLANT
NDL FILTER BLUNT 18X1 1/2 (NEEDLE) ×1 IMPLANT
NEEDLE FILTER BLUNT 18X 1/2SAF (NEEDLE) ×1
NEEDLE FILTER BLUNT 18X1 1/2 (NEEDLE) ×1 IMPLANT
SYR 3ML LL SCALE MARK (SYRINGE) ×2 IMPLANT
WATER STERILE IRR 250ML POUR (IV SOLUTION) ×2 IMPLANT

## 2021-06-30 NOTE — Transfer of Care (Signed)
Immediate Anesthesia Transfer of Care Note  Patient: Darlene Mcdowell  Procedure(s) Performed: CATARACT EXTRACTION PHACO AND INTRAOCULAR LENS PLACEMENT (IOC) LEFT 6.29 00:38.4 (Left: Eye)  Patient Location: PACU  Anesthesia Type: No value filed.  Level of Consciousness: awake, alert  and patient cooperative  Airway and Oxygen Therapy: Patient Spontanous Breathing and Patient connected to supplemental oxygen  Post-op Assessment: Post-op Vital signs reviewed, Patient's Cardiovascular Status Stable, Respiratory Function Stable, Patent Airway and No signs of Nausea or vomiting  Post-op Vital Signs: Reviewed and stable  Complications: No notable events documented.

## 2021-06-30 NOTE — Anesthesia Preprocedure Evaluation (Signed)
Anesthesia Evaluation  Patient identified by MRN, date of birth, ID band Patient awake    Reviewed: Allergy & Precautions, H&P , NPO status , Patient's Chart, lab work & pertinent test results  History of Anesthesia Complications (+) PONV and history of anesthetic complications  Airway Mallampati: II  TM Distance: >3 FB Neck ROM: full    Dental no notable dental hx.    Pulmonary former smoker,    Pulmonary exam normal breath sounds clear to auscultation       Cardiovascular hypertension, Normal cardiovascular exam Rhythm:regular Rate:Normal     Neuro/Psych PSYCHIATRIC DISORDERS    GI/Hepatic GERD  ,  Endo/Other  Hypothyroidism   Renal/GU      Musculoskeletal   Abdominal   Peds  Hematology   Anesthesia Other Findings   Reproductive/Obstetrics                             Anesthesia Physical Anesthesia Plan  ASA: 2  Anesthesia Plan:    Post-op Pain Management: Minimal or no pain anticipated   Induction:   PONV Risk Score and Plan: 3 and Treatment may vary due to age or medical condition, TIVA, Midazolam and Ondansetron  Airway Management Planned:   Additional Equipment:   Intra-op Plan:   Post-operative Plan:   Informed Consent: I have reviewed the patients History and Physical, chart, labs and discussed the procedure including the risks, benefits and alternatives for the proposed anesthesia with the patient or authorized representative who has indicated his/her understanding and acceptance.     Dental Advisory Given  Plan Discussed with: CRNA  Anesthesia Plan Comments:         Anesthesia Quick Evaluation

## 2021-06-30 NOTE — Anesthesia Postprocedure Evaluation (Signed)
Anesthesia Post Note  Patient: Darlene Mcdowell  Procedure(s) Performed: CATARACT EXTRACTION PHACO AND INTRAOCULAR LENS PLACEMENT (IOC) LEFT 6.29 00:38.4 (Left: Eye)     Patient location during evaluation: PACU Level of consciousness: awake and alert and oriented Pain management: satisfactory to patient Vital Signs Assessment: post-procedure vital signs reviewed and stable Respiratory status: spontaneous breathing, nonlabored ventilation and respiratory function stable Cardiovascular status: blood pressure returned to baseline and stable Postop Assessment: Adequate PO intake and No signs of nausea or vomiting Anesthetic complications: no   No notable events documented.  Raliegh Ip

## 2021-06-30 NOTE — H&P (Signed)
[facility]   Primary Care Physician:  Cyndi Bender, PA-C Ophthalmologist: Dr. Hortense Ramal  Pre-Procedure History & Physical: HPI:  Darlene Mcdowell is a 71 y.o. female here for cataract surgery.   Past Medical History:  Diagnosis Date   Anxiety    Cancer ([place])    skin   GERD (gastroesophageal reflux disease)    Hyperlipidemia    Hypertension    Hypothyroidism    Motion sickness    Sciatica of right side    Spinal stenosis of lumbar region    Thyroid disease    under active thyroid problems   Vitamin D deficiency     Past Surgical History:  Procedure Laterality Date   LUMBAR LAMINECTOMY  2021   [place] SURGERY  01/12/2003   cervical c-7   TUBAL LIGATION  01/12/1979    Prior to Admission medications   Medication Sig Start Date End Date Taking? Authorizing Provider  ALPRAZolam Duanne Moron) 0.25 MG tablet Take 1 tablet by mouth once daily as needed for panic attacks 12/19/15  Yes Pleas Koch, NP  aspirin 81 MG chewable tablet Chew 81 mg by mouth daily.   Yes [provider]  atorvastatin (LIPITOR) 10 MG tablet Take 10 mg by mouth daily.   Yes [provider]  Calcium Carb-Cholecalciferol (CALCIUM 500 + D PO) Take 2 tablets by mouth daily.   Yes [provider]  Capsaicin-Menthol (SALONPAS GEL-PATCH HOT EX) Apply topically as needed.   Yes [provider]  Coenzyme Q10 (COQ10 PO) Take 200 mg by mouth daily.   Yes [provider]  diphenhydramine-acetaminophen (TYLENOL PM) 25-500 MG TABS tablet Take 1 tablet by mouth at bedtime as needed.   Yes [provider]  Homeopathic Products (LEG CRAMP RELIEF PO) Take 2 tablets by mouth daily as needed.   Yes [provider]  levothyroxine (SYNTHROID, LEVOTHROID) 100 MCG tablet TAKE 1 TABLET BY MOUTH EVERY DAY 08/13/16  Yes Pleas Koch, NP  meloxicam (MOBIC) 15 MG tablet Take 15 mg by mouth daily.   Yes [provider]   methocarbamol (ROBAXIN) 500 MG tablet Take 1 tablet (500 mg total) by mouth 4 (four) times daily. Patient taking differently: Take 500 mg by mouth 4 (four) times daily. Takes 1 at bedtime 01/31/21  Yes Cuthriell, Charline Bills, PA-C  omeprazole (PRILOSEC) 20 MG capsule TAKE 1 CAPSULE BY MOUTH EVERY DAY 02/17/16  Yes Pleas Koch, NP  valsartan (DIOVAN) 320 MG tablet Take 320 mg by mouth daily.   Yes [provider]  ondansetron (ZOFRAN-ODT) 4 MG disintegrating tablet Take 1 tablet (4 mg total) by mouth every 8 (eight) hours as needed for nausea or vomiting. Patient not taking: Reported on 06/23/2021 02/01/21   Cuthriell, Charline Bills, PA-C    Allergies as of 06/01/2021   (No Known Allergies)    Family History  Problem Relation Age of Onset   Stroke Mother    Hypertension Mother    Hyperlipidemia Father    Heart disease Father    Hypertension Father    Cancer Maternal Grandmother    Heart disease Maternal Grandfather    Heart disease Paternal Grandmother    Breast cancer Neg Hx     Social History   Socioeconomic History   Marital status: Married    Spouse name: Not on file   Number of children: Not on file   Years of education: Not on file   Highest education level: Not  on file  Occupational History   Not on file  Tobacco Use   Smoking status: Former    Types: Cigarettes    Quit date: 01/12/2003    Years since quitting: 18.4   Smokeless tobacco: Never  Vaping Use   Vaping Use: Never used  Substance and Sexual Activity   Alcohol use: Yes    Comment: occasional   Drug use: No   Sexual activity: Yes    Partners: Male    Comment: With husband  Other Topics Concern   Not on file  Social History Narrative   Mom passed away this year in 08-02-22    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   Married for 64 years    1 daughter is 20 with previous marriage, lives in supported independent living (Down Syndrome)   2 Cats    Some college       Social  Determinants of Radio broadcast assistant Strain: Not on file  Food Insecurity: Not on file  Transportation Needs: Not on file  Physical Activity: Not on file  Stress: Not on file  Social Connections: Not on file  Intimate Partner Violence: Not on file    Review of Systems: See HPI, otherwise negative ROS  Physical Exam: BP (!) 160/95   Pulse 75   Temp (!) 97.2 F (36.2 C) (Temporal)   Ht '5\' 7"'$  (1.702 m)   Wt 84.5 kg   LMP 01/11/1994   SpO2 96%   BMI 29.16 kg/m  General:   Alert, cooperative in NAD Head:  Normocephalic and atraumatic. Respiratory:  Normal work of breathing. Cardiovascular:  RRR  Impression/Plan: Darlene Mcdowell is here for cataract surgery.  Risks, benefits, limitations, and alternatives regarding cataract surgery have been reviewed with the patient.  Questions have been answered.  All parties agreeable.   Birder Robson, MD  06/30/2021, 10:53 AM

## 2021-06-30 NOTE — Anesthesia Procedure Notes (Signed)
Procedure Name: MAC Date/Time: 06/30/2021 10:58 AM  Performed by: Dionne Bucy, CRNAPre-anesthesia Checklist: Patient identified, Emergency Drugs available, Suction available, Patient being monitored and Timeout performed Patient Re-evaluated:Patient Re-evaluated prior to induction Oxygen Delivery Method: Nasal cannula Placement Confirmation: positive ETCO2

## 2021-06-30 NOTE — Op Note (Signed)
PREOPERATIVE DIAGNOSIS:  Nuclear sclerotic cataract of the left eye.   POSTOPERATIVE DIAGNOSIS:  Nuclear sclerotic cataract of the left eye.   OPERATIVE PROCEDURE:ORPROCALL@   SURGEON:  Birder Robson, MD.   ANESTHESIA:  Anesthesiologist: Ronelle Nigh, MD CRNA: Dionne Bucy, CRNA  1.      Managed anesthesia care. 2.     0.34m of Shugarcaine was instilled following the paracentesis   COMPLICATIONS:  None.   TECHNIQUE:   Stop and chop   DESCRIPTION OF PROCEDURE:  The patient was examined and consented in the preoperative holding area where the aforementioned topical anesthesia was applied to the left eye and then brought back to the Operating Room where the left eye was prepped and draped in the usual sterile ophthalmic fashion and a lid speculum was placed. A paracentesis was created with the side port blade and the anterior chamber was filled with viscoelastic. A near clear corneal incision was performed with the steel keratome. A continuous curvilinear capsulorrhexis was performed with a cystotome followed by the capsulorrhexis forceps. Hydrodissection and hydrodelineation were carried out with BSS on a blunt cannula. The lens was removed in a stop and chop  technique and the remaining cortical material was removed with the irrigation-aspiration handpiece. The capsular bag was inflated with viscoelastic and the Technis [id number] lens was placed in the capsular bag without complication. The remaining viscoelastic was removed from the eye with the irrigation-aspiration handpiece. The wounds were hydrated. The anterior chamber was flushed with BSS and the eye was inflated to physiologic pressure. 0.15mVigamox was placed in the anterior chamber. The wounds were found to be water tight. The eye was dressed with Combigan. The patient was given protective glasses to wear throughout the day and a shield with which to sleep tonight. The patient was also given drops with which to begin a drop regimen  today and will follow-up with me in one day. Implant Name Type Inv. Item Serial No. Manufacturer Lot No. LRB No. Used Action  LENS IOL TECNIS EYHANCE 21.0 - [id number] Lens LENS IOL TECNIS EYHANCE 21.0 [id number]  Left 1 Implanted    Procedure(s) with comments: CATARACT EXTRACTION PHACO AND INTRAOCULAR LENS PLACEMENT (IOC) LEFT 6.29 00:38.4 (Left) - mid morning or late arrival  Electronically signed: WiBirder Robson/20/2023 11:17 AM

## 2021-07-01 ENCOUNTER — Encounter: Payer: Self-pay | Admitting: Ophthalmology

## 2021-07-20 ENCOUNTER — Ambulatory Visit
Admission: RE | Admit: 2021-07-20 | Discharge: 2021-07-20 | Disposition: A | Discharge status: Home / Self Care | Payer: Medicare Other | Source: Ambulatory Visit | Attending: Physician Assistant | Admitting: Physician Assistant

## 2021-07-20 DIAGNOSIS — E559 Vitamin D deficiency, unspecified: Secondary | ICD-10-CM | POA: Insufficient documentation

## 2021-07-20 DIAGNOSIS — Z1231 Encounter for screening mammogram for malignant neoplasm of breast: Secondary | ICD-10-CM | POA: Insufficient documentation

## 2021-07-20 DIAGNOSIS — E039 Hypothyroidism, unspecified: Secondary | ICD-10-CM | POA: Diagnosis not present

## 2021-07-20 DIAGNOSIS — M1990 Unspecified osteoarthritis, unspecified site: Secondary | ICD-10-CM | POA: Diagnosis not present

## 2021-07-20 DIAGNOSIS — Z780 Asymptomatic menopausal state: Secondary | ICD-10-CM | POA: Diagnosis not present

## 2021-07-20 DIAGNOSIS — Z13820 Encounter for screening for osteoporosis: Secondary | ICD-10-CM | POA: Insufficient documentation

## 2021-07-20 DIAGNOSIS — M85851 Other specified disorders of bone density and structure, right thigh: Secondary | ICD-10-CM | POA: Insufficient documentation

## 2021-07-20 DIAGNOSIS — M8580 Other specified disorders of bone density and structure, unspecified site: Secondary | ICD-10-CM | POA: Insufficient documentation

## 2021-11-03 ENCOUNTER — Encounter: Payer: Self-pay | Admitting: *Deleted

## 2021-11-03 ENCOUNTER — Encounter: Payer: Medicare Other | Attending: Physician Assistant | Admitting: *Deleted

## 2021-11-03 VITALS — BP 118/70 | Ht 67.0 in | Wt 178.1 lb

## 2021-11-03 DIAGNOSIS — E119 Type 2 diabetes mellitus without complications: Secondary | ICD-10-CM | POA: Diagnosis not present

## 2021-11-03 DIAGNOSIS — Z713 Dietary counseling and surveillance: Secondary | ICD-10-CM | POA: Diagnosis not present

## 2021-11-03 NOTE — Progress Notes (Signed)
Diabetes Self-Management Education  Visit Type: First/Initial  Appt. Start Time: 1325 Appt. End Time: 6948  11/03/2021  [name], identified by name and date of birth, is a 71 y.o. female with a diagnosis of Diabetes: Type 2.   ASSESSMENT  Blood pressure 118/70, height '5\' 7"'$  (1.702 m), weight 178 lb 1.6 oz (80.8 kg), last menstrual period 01/11/1994. Body mass index is 27.89 kg/m.   Diabetes Self-Management Education - 11/03/21 1642       Visit Information   Visit Type First/Initial      Initial Visit   Diabetes Type Type 2    Date Diagnosed 2 months ago    Are you currently following a meal plan? Yes    What type of meal plan do you follow? "reducing sugar and carbs"    Are you taking your medications as prescribed? Yes      Health Coping   How would you rate your overall health? Good      Psychosocial Assessment   Patient Belief/Attitude about Diabetes Motivated to manage diabetes    What is the hardest part about your diabetes right now, causing you the most concern, or is the most worrisome to you about your diabetes?   Making healty food and beverage choices;Being active    Self-care barriers None    Self-management support Doctor's office;Family    Patient Concerns Nutrition/Meal planning;Glycemic Control;Weight Control;Healthy Lifestyle    Special Needs None    Preferred Learning Style Hands on;Visual    Learning Readiness Change in progress    How often do you need to have someone help you when you read instructions, pamphlets, or other written materials from your doctor or pharmacy? 1 - Never      Pre-Education Assessment   Patient understands the diabetes disease and treatment process. Needs Instruction    Patient understands incorporating nutritional management into lifestyle. Needs Instruction    Patient undertands incorporating physical activity into lifestyle. Needs Instruction    Patient understands using medications safely. Needs Instruction     Patient understands monitoring blood glucose, interpreting and using results Comprehends key points    Patient understands prevention, detection, and treatment of acute complications. Needs Instruction    Patient understands prevention, detection, and treatment of chronic complications. Needs Instruction    Patient understands how to develop strategies to address psychosocial issues. Needs Instruction    Patient understands how to develop strategies to promote health/change behavior. Needs Instruction      Complications   Last HgB A1C per patient/outside source 7 %   09/04/2021   How often do you check your blood sugar? 1-2 times/day    Fasting Blood glucose range (mg/dL) 70-129;130-179   90-150 mg/dL for the last 3 weeks   Have you had a dilated eye exam in the past 12 months? Yes    Have you had a dental exam in the past 12 months? No    Are you checking your feet? Yes    How many days per week are you checking your feet? 7      Dietary Intake   Breakfast skips - keto cereal dry - no milk    Snack (morning) 0-1 snack - protein bar, banana, peaches    Lunch sandwich - chicken salad, peanut butter or keto bread, pork skins    Dinner chicken, beef, pork, fish; potatoes, corn, peas, beans, pasta, brown rice, carrots, broccoli, brussell sprouts, cabbage, sauerkraut, green beans    Beverage(s) water, Gatorade zero  Activity / Exercise   Activity / Exercise Type ADL's      Patient Education   Previous Diabetes Education No    Disease Pathophysiology Definition of diabetes, type 1 and 2, and the diagnosis of diabetes;Factors that contribute to the development of diabetes;Explored patient's options for treatment of their diabetes    Healthy Eating Role of diet in the treatment of diabetes and the relationship between the three main macronutrients and blood glucose level;Food label reading, portion sizes and measuring food.;Plate Method;Reviewed blood glucose goals for pre and post meals and  how to evaluate the patients' food intake on their blood glucose level.    Being Active Role of exercise on diabetes management, blood pressure control and cardiac health.    Monitoring Taught/evaluated SMBG meter.;Purpose and frequency of SMBG.;Taught/discussed recording of test results and interpretation of SMBG.;Identified appropriate SMBG and/or A1C goals.    Chronic complications Relationship between chronic complications and blood glucose control    Diabetes Stress and Support Identified and addressed patients feelings and concerns about diabetes;Role of stress on diabetes      Individualized Goals (developed by patient)   Reducing Risk Other (comment)   improve blood sugars, prevent diabetes complications, lose weight, lead a healthier lifestyle     Outcomes   Expected Outcomes Demonstrated interest in learning. Expect positive outcomes         Individualized Plan for Diabetes Self-Management Training:   Learning Objective:  Patient will have a greater understanding of diabetes self-management. Patient education plan is to attend individual and/or group sessions per assessed needs and concerns.   Plan:   Patient Instructions  Check blood sugars before breakfast or 2 hours after one meal every day Bring blood sugar records to the next class  Exercise:  Walk as tolerated  Eat 3 meals day,   1-2  snacks a day Space meals 4-6 hours apart Don't skip meals - eat 1 protein and 1 carbohydrate serving Increase water intake to 4-5 servings/day  Call back to schedule classes  Expected Outcomes:  Demonstrated interest in learning. Expect positive outcomes  Education material provided:  General Meal Planning Guidelines Simple Meal Plan Diabetes Plate Method (ADA)  If problems or questions, patient to contact team via:   Johny Drilling, RN, [place], [place] [phone]  Future DSME appointment:  Pt to check her calendar and call back to schedule classes

## 2021-11-03 NOTE — Patient Instructions (Addendum)
Check blood sugars before breakfast or 2 hours after one meal every day Bring blood sugar records to the next class  Exercise:  Walk as tolerated  Eat 3 meals day,   1-2  snacks a day Space meals 4-6 hours apart Don't skip meals - eat 1 protein and 1 carbohydrate serving Increase water intake to 4-5 servings/day  Call back to schedule classes

## 2021-11-10 ENCOUNTER — Other Ambulatory Visit: Payer: Self-pay | Admitting: Physician Assistant

## 2021-11-10 DIAGNOSIS — R918 Other nonspecific abnormal finding of lung field: Secondary | ICD-10-CM

## 2021-11-24 ENCOUNTER — Ambulatory Visit
Admission: RE | Admit: 2021-11-24 | Discharge: 2021-11-24 | Disposition: A | Discharge status: Home / Self Care | Payer: Medicare Other | Source: Ambulatory Visit | Attending: Physician Assistant | Admitting: Physician Assistant

## 2021-11-24 DIAGNOSIS — R918 Other nonspecific abnormal finding of lung field: Secondary | ICD-10-CM | POA: Diagnosis present

## 2021-11-25 ENCOUNTER — Encounter: Payer: Self-pay | Admitting: Ophthalmology

## 2021-12-01 ENCOUNTER — Ambulatory Visit: Payer: Medicare Other | Admitting: Anesthesiology

## 2021-12-01 ENCOUNTER — Encounter
Admission: RE | Disposition: A | Discharge status: Home / Self Care | Payer: Self-pay | Source: Ambulatory Visit | Attending: Ophthalmology

## 2021-12-01 ENCOUNTER — Other Ambulatory Visit: Payer: Self-pay

## 2021-12-01 ENCOUNTER — Ambulatory Visit
Admission: RE | Admit: 2021-12-01 | Discharge: 2021-12-01 | Disposition: A | Discharge status: Home / Self Care | Payer: Medicare Other | Source: Ambulatory Visit | Attending: Ophthalmology | Admitting: Ophthalmology

## 2021-12-01 ENCOUNTER — Encounter: Payer: Self-pay | Admitting: Ophthalmology

## 2021-12-01 DIAGNOSIS — H2511 Age-related nuclear cataract, right eye: Secondary | ICD-10-CM | POA: Insufficient documentation

## 2021-12-01 DIAGNOSIS — E039 Hypothyroidism, unspecified: Secondary | ICD-10-CM | POA: Insufficient documentation

## 2021-12-01 DIAGNOSIS — Z8249 Family history of ischemic heart disease and other diseases of the circulatory system: Secondary | ICD-10-CM | POA: Diagnosis not present

## 2021-12-01 DIAGNOSIS — Z87891 Personal history of nicotine dependence: Secondary | ICD-10-CM | POA: Insufficient documentation

## 2021-12-01 DIAGNOSIS — I10 Essential (primary) hypertension: Secondary | ICD-10-CM | POA: Insufficient documentation

## 2021-12-01 DIAGNOSIS — K219 Gastro-esophageal reflux disease without esophagitis: Secondary | ICD-10-CM | POA: Diagnosis not present

## 2021-12-01 DIAGNOSIS — E1136 Type 2 diabetes mellitus with diabetic cataract: Secondary | ICD-10-CM | POA: Diagnosis present

## 2021-12-01 HISTORY — PX: CATARACT EXTRACTION W/PHACO: SHX586

## 2021-12-01 SURGERY — PHACOEMULSIFICATION, CATARACT, WITH IOL INSERTION
Anesthesia: Monitor Anesthesia Care | Site: Eye | Laterality: Right

## 2021-12-01 MED ORDER — SIGHTPATH DOSE#1 NA CHONDROIT SULF-NA HYALURON 40-17 MG/ML IO SOLN
INTRAOCULAR | Status: DC | PRN
  Administered 2021-12-01: 1 mL via INTRAOCULAR

## 2021-12-01 MED ORDER — LACTATED RINGERS IV SOLN: INTRAVENOUS | Status: DC

## 2021-12-01 MED ORDER — FENTANYL CITRATE (PF) 100 MCG/2ML IJ SOLN
INTRAMUSCULAR | Status: DC | PRN
  Administered 2021-12-01: 100 ug via INTRAVENOUS

## 2021-12-01 MED ORDER — MOXIFLOXACIN HCL 0.5 % OP SOLN
OPHTHALMIC | Status: DC | PRN
  Administered 2021-12-01: .2 mL via OPHTHALMIC

## 2021-12-01 MED ORDER — SIGHTPATH DOSE#1 BSS IO SOLN
INTRAOCULAR | Status: DC | PRN
  Administered 2021-12-01: 53 mL via OPHTHALMIC

## 2021-12-01 MED ORDER — MIDAZOLAM HCL 2 MG/2ML IJ SOLN
INTRAMUSCULAR | Status: DC | PRN
  Administered 2021-12-01: 2 mg via INTRAVENOUS

## 2021-12-01 MED ORDER — ARMC OPHTHALMIC DILATING DROPS
1.0000 | OPHTHALMIC | Status: DC | PRN
  Administered 2021-12-01 (×3): 1 via OPHTHALMIC

## 2021-12-01 MED ORDER — SIGHTPATH DOSE#1 BSS IO SOLN
INTRAOCULAR | Status: DC | PRN
  Administered 2021-12-01: 15 mL

## 2021-12-01 MED ORDER — SIGHTPATH DOSE#1 BSS IO SOLN
INTRAOCULAR | Status: DC | PRN
  Administered 2021-12-01: 1 mL via INTRAMUSCULAR

## 2021-12-01 MED ORDER — BRIMONIDINE TARTRATE-TIMOLOL 0.2-0.5 % OP SOLN
OPHTHALMIC | Status: DC | PRN
  Administered 2021-12-01: 1 [drp] via OPHTHALMIC

## 2021-12-01 MED ORDER — TETRACAINE HCL 0.5 % OP SOLN
1.0000 [drp] | OPHTHALMIC | Status: DC | PRN
  Administered 2021-12-01 (×3): 1 [drp] via OPHTHALMIC

## 2021-12-01 SURGICAL SUPPLY — 9 items
CATARACT SUITE SIGHTPATH (MISCELLANEOUS) ×1 IMPLANT
FEE CATARACT SUITE SIGHTPATH (MISCELLANEOUS) ×1 IMPLANT
GLOVE SURG ENC TEXT LTX SZ8 (GLOVE) ×1 IMPLANT
GLOVE SURG TRIUMPH 8.0 PF LTX (GLOVE) ×1 IMPLANT
LENS IOL TECNIS EYHANCE 21.5 (Intraocular Lens) IMPLANT
NDL FILTER BLUNT 18X1 1/2 (NEEDLE) ×1 IMPLANT
NEEDLE FILTER BLUNT 18X1 1/2 (NEEDLE) ×1 IMPLANT
SYR 3ML LL SCALE MARK (SYRINGE) ×1 IMPLANT
WATER STERILE IRR 250ML POUR (IV SOLUTION) ×1 IMPLANT

## 2021-12-01 NOTE — H&P (Signed)
[facility]   Primary Care Physician:  Cyndi Bender, PA-C Ophthalmologist: Dr. George Ina  Pre-Procedure History & Physical: HPI:  Darlene Mcdowell is a 71 y.o. female here for cataract surgery.   Past Medical History:  Diagnosis Date   Anxiety    Cancer ([place])    skin   Diabetes mellitus without complication ([place])    Diet controlled   GERD (gastroesophageal reflux disease)    Hyperlipidemia    Hypertension    Hypothyroidism    Motion sickness    Sciatica of right side    Spinal stenosis of lumbar region    Thyroid disease    under active thyroid problems   Vitamin D deficiency     Past Surgical History:  Procedure Laterality Date   CATARACT EXTRACTION W/PHACO Left 06/30/2021   Procedure: CATARACT EXTRACTION PHACO AND INTRAOCULAR LENS PLACEMENT (IOC) LEFT 6.29 00:38.4;  Surgeon: Birder Robson, MD;  Location: [place];  Service: Ophthalmology;  Laterality: Left;  mid morning or late arrival   [place] SURGERY  01/12/2003   cervical c-7   TUBAL LIGATION  01/12/1979    Prior to Admission medications   Medication Sig Start Date End Date Taking? Authorizing Provider  ALPRAZolam Duanne Moron) 0.25 MG tablet Take 1 tablet by mouth once daily as needed for panic attacks 12/19/15  Yes Pleas Koch, NP  aspirin 81 MG chewable tablet Chew 81 mg by mouth daily.   Yes [provider]  atorvastatin (LIPITOR) 10 MG tablet Take 10 mg by mouth daily.   Yes [provider]  Coenzyme Q10 (COQ10 PO) Take 200 mg by mouth daily.   Yes [provider]  diphenhydramine-acetaminophen (TYLENOL PM) 25-500 MG TABS tablet Take 1 tablet by mouth at bedtime as needed.   Yes [provider]  gabapentin (NEURONTIN) 100 MG capsule TAKE 1 CAPSULE BY MOUTH EVERY MORNING AND 2 CAPSULES EVERY NIGHT AT BEDTIME 01/10/21  Yes [provider]  Homeopathic Products (LEG CRAMP RELIEF PO) Take 2 tablets by  mouth daily as needed.   Yes [provider]  levothyroxine (SYNTHROID, LEVOTHROID) 100 MCG tablet TAKE 1 TABLET BY MOUTH EVERY DAY 08/13/16  Yes Pleas Koch, NP  Menthol-Methyl Salicylate (SALONPAS PAIN RELIEF PATCH EX) Apply topically daily as needed.   Yes [provider]  omeprazole (PRILOSEC) 20 MG capsule TAKE 1 CAPSULE BY MOUTH EVERY DAY 02/17/16  Yes Pleas Koch, NP  valsartan (DIOVAN) 320 MG tablet Take 320 mg by mouth daily.   Yes [provider]  ACCU-CHEK GUIDE test strip USE TO TEST 1 (ONE) EACH FOR ONCE DAILY GLUCOSE MONITORING 10/01/21   [provider]  Accu-Chek Softclix Lancets lancets SMARTSIG:1 Each Topical As Directed 10/02/21   [provider]  methocarbamol (ROBAXIN) 500 MG tablet Take 1 tablet (500 mg total) by mouth 4 (four) times daily. Patient not taking: Reported on 11/25/2021 01/31/21   Cuthriell, Charline Bills, PA-C  ondansetron (ZOFRAN-ODT) 4 MG disintegrating tablet Take 1 tablet (4 mg total) by mouth every 8 (eight) hours as needed for nausea or vomiting. Patient not taking: Reported on 11/25/2021 02/01/21   Cuthriell, Charline Bills, PA-C    Allergies as of 11/25/2021 - Review Complete 11/25/2021  Allergen Reaction Noted   Hydrocodone Other (See Comments) 11/03/2021   Oxycodone Nausea And Vomiting 06/23/2021   Pantoprazole  11/25/2021   Tizanidine  11/25/2021   Wellbutrin [bupropion] Other (See Comments)  06/23/2021    Family History  Problem Relation Age of Onset   Stroke Mother    Hypertension Mother    Hyperlipidemia Father    Heart disease Father    Hypertension Father    Cancer Maternal Grandmother    Heart disease Maternal Grandfather    Heart disease Paternal Grandmother    Breast cancer Neg Hx     Social History   Socioeconomic History   Marital status: Married    Spouse name: Not on file   Number of children: Not on file   Years of education: Not on file   Highest education level: Not on  file  Occupational History   Not on file  Tobacco Use   Smoking status: Former    Packs/day: 1.00    Years: 15.00    Total pack years: 15.00    Types: Cigarettes    Quit date: 01/12/2003    Years since quitting: 18.8   Smokeless tobacco: Never  Vaping Use   Vaping Use: Never used  Substance and Sexual Activity   Alcohol use: Yes    Comment: occasional   Drug use: No   Sexual activity: Yes    Partners: Male    Comment: With husband  Other Topics Concern   Not on file  Social History Narrative   Mom passed away this year in 01-Aug-2022    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   Married for 9 years    1 daughter is 71 with previous marriage, lives in supported independent living (Down Syndrome)   2 Cats    Some college       Social Determinants of Radio broadcast assistant Strain: Not on file  Food Insecurity: Not on file  Transportation Needs: Not on file  Physical Activity: Not on file  Stress: Not on file  Social Connections: Not on file  Intimate Partner Violence: Not on file    Review of Systems: See HPI, otherwise negative ROS  Physical Exam: BP 110/73   Temp (!) 97.2 F (36.2 C) (Tympanic)   Ht 5' 7.01" (1.702 m)   Wt 80.8 kg   LMP 01/11/1994   SpO2 96%   BMI 27.90 kg/m  General:   Alert, cooperative in NAD Head:  Normocephalic and atraumatic. Respiratory:  Normal work of breathing. Cardiovascular:  RRR  Impression/Plan: Darlene Mcdowell is here for cataract surgery.  Risks, benefits, limitations, and alternatives regarding cataract surgery have been reviewed with the patient.  Questions have been answered.  All parties agreeable.   Birder Robson, MD  12/01/2021, 10:49 AM

## 2021-12-01 NOTE — Transfer of Care (Signed)
Immediate Anesthesia Transfer of Care Note  Patient: Darlene Mcdowell  Procedure(s) Performed: CATARACT EXTRACTION PHACO AND INTRAOCULAR LENS PLACEMENT (IOC) RIGHT  5.25  00:34.2 (Right: Eye)  Patient Location: PACU  Anesthesia Type: MAC  Level of Consciousness: awake, alert  and patient cooperative  Airway and Oxygen Therapy: Patient Spontanous Breathing and Patient connected to supplemental oxygen  Post-op Assessment: Post-op Vital signs reviewed, Patient's Cardiovascular Status Stable, Respiratory Function Stable, Patent Airway and No signs of Nausea or vomiting  Post-op Vital Signs: Reviewed and stable  Complications: No notable events documented.

## 2021-12-01 NOTE — Discharge Instructions (Signed)

## 2021-12-01 NOTE — Op Note (Signed)
PREOPERATIVE DIAGNOSIS:  Nuclear sclerotic cataract of the right eye.   POSTOPERATIVE DIAGNOSIS:  H25.11 Cataract   OPERATIVE PROCEDURE:ORPROCALL@   SURGEON:  Birder Robson, MD.   ANESTHESIA:  Anesthesiologist: Dimas Millin, MD CRNA: Tobie Poet, CRNA  1.      Managed anesthesia care. 2.      0.30m of Shugarcaine was instilled in the eye following the paracentesis.   COMPLICATIONS:  None.   TECHNIQUE:   Stop and chop   DESCRIPTION OF PROCEDURE:  The patient was examined and consented in the preoperative holding area where the aforementioned topical anesthesia was applied to the right eye and then brought back to the Operating Room where the right eye was prepped and draped in the usual sterile ophthalmic fashion and a lid speculum was placed. A paracentesis was created with the side port blade and the anterior chamber was filled with viscoelastic. A near clear corneal incision was performed with the steel keratome. A continuous curvilinear capsulorrhexis was performed with a cystotome followed by the capsulorrhexis forceps. Hydrodissection and hydrodelineation were carried out with BSS on a blunt cannula. The lens was removed in a stop and chop  technique and the remaining cortical material was removed with the irrigation-aspiration handpiece. The capsular bag was inflated with viscoelastic and the Technis [id number]  lens was placed in the capsular bag without complication. The remaining viscoelastic was removed from the eye with the irrigation-aspiration handpiece. The wounds were hydrated. The anterior chamber was flushed with BSS and the eye was inflated to physiologic pressure. 0.147mof Vigamox was placed in the anterior chamber. The wounds were found to be water tight. The eye was dressed with Combigan. The patient was given protective glasses to wear throughout the day and a shield with which to sleep tonight. The patient was also given drops with which to begin a drop regimen today  and will follow-up with me in one day. Implant Name Type Inv. Item Serial No. Manufacturer Lot No. LRB No. Used Action  LENS IOL TECNIS EYHANCE 21.5 - [id number] Lens LENS IOL TECNIS EYHANCE 21.5 [id number]  Right 1 Implanted   Procedure(s): CATARACT EXTRACTION PHACO AND INTRAOCULAR LENS PLACEMENT (IOC) RIGHT  5.25  00:34.2 (Right)  Electronically signed: WiBirder Robson1/21/2023 11:16 AM

## 2021-12-01 NOTE — Anesthesia Postprocedure Evaluation (Signed)
Anesthesia Post Note  Patient: Darlene Mcdowell  Procedure(s) Performed: CATARACT EXTRACTION PHACO AND INTRAOCULAR LENS PLACEMENT (IOC) RIGHT  5.25  00:34.2 (Right: Eye)  Patient location during evaluation: PACU Anesthesia Type: MAC Level of consciousness: awake and alert Pain management: pain level controlled Vital Signs Assessment: post-procedure vital signs reviewed and stable Respiratory status: spontaneous breathing, nonlabored ventilation, respiratory function stable and patient connected to nasal cannula oxygen Cardiovascular status: stable and blood pressure returned to baseline Postop Assessment: no apparent nausea or vomiting Anesthetic complications: no  No notable events documented.   Last Vitals:  Vitals:   12/01/21 1118 12/01/21 1125  BP: 94/60 108/62  Pulse: 66 65  Resp: 17 13  Temp: 36.6 C   SpO2: 96% 94%    Last Pain:  Vitals:   12/01/21 1125  TempSrc:   PainSc: 0-No pain                 Dimas Millin

## 2021-12-01 NOTE — Anesthesia Preprocedure Evaluation (Signed)
Anesthesia Evaluation  Patient identified by MRN, date of birth, ID band Patient awake    Reviewed: Allergy & Precautions, H&P , NPO status , Patient's Chart, lab work & pertinent test results  History of Anesthesia Complications (+) PONV and history of anesthetic complications  Airway Mallampati: II  TM Distance: >3 FB Neck ROM: full    Dental no notable dental hx.    Pulmonary former smoker   Pulmonary exam normal breath sounds clear to auscultation       Cardiovascular hypertension, Normal cardiovascular exam Rhythm:regular Rate:Normal     Neuro/Psych  PSYCHIATRIC DISORDERS         GI/Hepatic ,GERD  ,,  Endo/Other  diabetesHypothyroidism    Renal/GU      Musculoskeletal   Abdominal   Peds  Hematology   Anesthesia Other Findings   Reproductive/Obstetrics                             Anesthesia Physical Anesthesia Plan  ASA: 2  Anesthesia Plan: MAC   Post-op Pain Management: Minimal or no pain anticipated   Induction:   PONV Risk Score and Plan: 3 and Treatment may vary due to age or medical condition, TIVA, Midazolam and Ondansetron  Airway Management Planned:   Additional Equipment:   Intra-op Plan:   Post-operative Plan:   Informed Consent: I have reviewed the patients History and Physical, chart, labs and discussed the procedure including the risks, benefits and alternatives for the proposed anesthesia with the patient or authorized representative who has indicated his/her understanding and acceptance.     Dental Advisory Given  Plan Discussed with: CRNA  Anesthesia Plan Comments:         Anesthesia Quick Evaluation

## 2021-12-02 ENCOUNTER — Encounter: Payer: Self-pay | Admitting: Ophthalmology

## 2021-12-07 ENCOUNTER — Encounter: Payer: Self-pay | Admitting: *Deleted

## 2022-05-18 ENCOUNTER — Other Ambulatory Visit: Payer: Self-pay | Admitting: Otolaryngology

## 2022-06-04 ENCOUNTER — Other Ambulatory Visit: Payer: Self-pay

## 2022-06-04 ENCOUNTER — Encounter (HOSPITAL_BASED_OUTPATIENT_CLINIC_OR_DEPARTMENT_OTHER): Payer: Self-pay | Admitting: Otolaryngology

## 2022-06-10 ENCOUNTER — Other Ambulatory Visit: Payer: Self-pay

## 2022-06-10 ENCOUNTER — Encounter (HOSPITAL_BASED_OUTPATIENT_CLINIC_OR_DEPARTMENT_OTHER)
Admission: RE | Admit: 2022-06-10 | Discharge: 2022-06-10 | Disposition: A | Discharge status: Home / Self Care | Payer: Medicare Other | Source: Ambulatory Visit | Attending: Otolaryngology | Admitting: Otolaryngology

## 2022-06-10 DIAGNOSIS — Z01810 Encounter for preprocedural cardiovascular examination: Secondary | ICD-10-CM | POA: Insufficient documentation

## 2022-06-13 NOTE — Anesthesia Preprocedure Evaluation (Addendum)
Anesthesia Evaluation  Patient identified by MRN, date of birth, ID band Patient awake    Reviewed: Allergy & Precautions, NPO status , Patient's Chart, lab work & pertinent test results, reviewed documented beta blocker date and time   History of Anesthesia Complications (+) PONV and history of anesthetic complications  Airway Mallampati: II  TM Distance: >3 FB     Dental no notable dental hx. (+) Teeth Intact   Pulmonary sleep apnea , former smoker   Pulmonary exam normal breath sounds clear to auscultation       Cardiovascular hypertension, Pt. on medications Normal cardiovascular exam Rhythm:Regular Rate:Normal     Neuro/Psych  PSYCHIATRIC DISORDERS Anxiety      Neuromuscular disease    GI/Hepatic Neg liver ROS,GERD  Medicated,,  Endo/Other  diabetes, Well Controlled, Type 2Hypothyroidism  Hyperlipidemia  Renal/GU negative Renal ROS  negative genitourinary   Musculoskeletal Lumbar spinal stenosis Sciatica - right   Abdominal Normal abdominal exam  (+)   Peds  Hematology negative hematology ROS (+)   Anesthesia Other Findings   Reproductive/Obstetrics                             Anesthesia Physical Anesthesia Plan  ASA: 3  Anesthesia Plan: General   Post-op Pain Management: Minimal or no pain anticipated   Induction: Intravenous  PONV Risk Score and Plan: Propofol infusion and Treatment may vary due to age or medical condition  Airway Management Planned: Natural Airway  Additional Equipment: None  Intra-op Plan:   Post-operative Plan:   Informed Consent: I have reviewed the patients History and Physical, chart, labs and discussed the procedure including the risks, benefits and alternatives for the proposed anesthesia with the patient or authorized representative who has indicated his/her understanding and acceptance.     Dental advisory given  Plan Discussed with:  Anesthesiologist and CRNA  Anesthesia Plan Comments:         Anesthesia Quick Evaluation

## 2022-06-15 ENCOUNTER — Ambulatory Visit (HOSPITAL_BASED_OUTPATIENT_CLINIC_OR_DEPARTMENT_OTHER): Payer: Medicare Other | Admitting: Anesthesiology

## 2022-06-15 ENCOUNTER — Encounter (HOSPITAL_BASED_OUTPATIENT_CLINIC_OR_DEPARTMENT_OTHER)
Admission: RE | Disposition: A | Discharge status: Home / Self Care | Payer: Self-pay | Source: Home / Self Care | Attending: Otolaryngology

## 2022-06-15 ENCOUNTER — Ambulatory Visit (HOSPITAL_BASED_OUTPATIENT_CLINIC_OR_DEPARTMENT_OTHER)
Admission: RE | Admit: 2022-06-15 | Discharge: 2022-06-15 | Disposition: A | Discharge status: Home / Self Care | Payer: Medicare Other | Attending: Otolaryngology | Admitting: Otolaryngology

## 2022-06-15 ENCOUNTER — Encounter (HOSPITAL_BASED_OUTPATIENT_CLINIC_OR_DEPARTMENT_OTHER): Payer: Self-pay | Admitting: Otolaryngology

## 2022-06-15 ENCOUNTER — Other Ambulatory Visit: Payer: Self-pay

## 2022-06-15 DIAGNOSIS — Z8349 Family history of other endocrine, nutritional and metabolic diseases: Secondary | ICD-10-CM | POA: Diagnosis not present

## 2022-06-15 DIAGNOSIS — Z87891 Personal history of nicotine dependence: Secondary | ICD-10-CM

## 2022-06-15 DIAGNOSIS — E039 Hypothyroidism, unspecified: Secondary | ICD-10-CM | POA: Insufficient documentation

## 2022-06-15 DIAGNOSIS — Z01818 Encounter for other preprocedural examination: Secondary | ICD-10-CM

## 2022-06-15 DIAGNOSIS — I10 Essential (primary) hypertension: Secondary | ICD-10-CM | POA: Insufficient documentation

## 2022-06-15 DIAGNOSIS — Z8249 Family history of ischemic heart disease and other diseases of the circulatory system: Secondary | ICD-10-CM | POA: Diagnosis not present

## 2022-06-15 DIAGNOSIS — F419 Anxiety disorder, unspecified: Secondary | ICD-10-CM | POA: Diagnosis not present

## 2022-06-15 DIAGNOSIS — G4733 Obstructive sleep apnea (adult) (pediatric): Secondary | ICD-10-CM

## 2022-06-15 DIAGNOSIS — E1149 Type 2 diabetes mellitus with other diabetic neurological complication: Secondary | ICD-10-CM | POA: Diagnosis not present

## 2022-06-15 DIAGNOSIS — E119 Type 2 diabetes mellitus without complications: Secondary | ICD-10-CM | POA: Insufficient documentation

## 2022-06-15 DIAGNOSIS — E785 Hyperlipidemia, unspecified: Secondary | ICD-10-CM | POA: Diagnosis not present

## 2022-06-15 DIAGNOSIS — G709 Myoneural disorder, unspecified: Secondary | ICD-10-CM | POA: Insufficient documentation

## 2022-06-15 DIAGNOSIS — Z6827 Body mass index (BMI) 27.0-27.9, adult: Secondary | ICD-10-CM | POA: Insufficient documentation

## 2022-06-15 DIAGNOSIS — K219 Gastro-esophageal reflux disease without esophagitis: Secondary | ICD-10-CM | POA: Insufficient documentation

## 2022-06-15 DIAGNOSIS — Z79899 Other long term (current) drug therapy: Secondary | ICD-10-CM | POA: Diagnosis not present

## 2022-06-15 DIAGNOSIS — E663 Overweight: Secondary | ICD-10-CM | POA: Diagnosis not present

## 2022-06-15 HISTORY — DX: Other specified postprocedural states: Z98.890

## 2022-06-15 HISTORY — DX: Nausea with vomiting, unspecified: R11.2

## 2022-06-15 HISTORY — PX: DRUG INDUCED ENDOSCOPY: SHX6808

## 2022-06-15 SURGERY — DRUG INDUCED SLEEP ENDOSCOPY
Anesthesia: General | Laterality: Bilateral

## 2022-06-15 MED ORDER — OXYMETAZOLINE HCL 0.05 % NA SOLN
NASAL | Status: DC | PRN
  Administered 2022-06-15: 1

## 2022-06-15 MED ORDER — PROPOFOL 10 MG/ML IV BOLUS
INTRAVENOUS | Status: DC | PRN
  Administered 2022-06-15: 20 mg via INTRAVENOUS
  Administered 2022-06-15: 10 mg via INTRAVENOUS

## 2022-06-15 MED ORDER — LIDOCAINE 2% (20 MG/ML) 5 ML SYRINGE
INTRAMUSCULAR | Status: DC | PRN
  Administered 2022-06-15: 100 mg via INTRAVENOUS

## 2022-06-15 MED ORDER — PROPOFOL 500 MG/50ML IV EMUL
INTRAVENOUS | Status: DC | PRN
  Administered 2022-06-15: 100 ug/kg/min via INTRAVENOUS

## 2022-06-15 MED ORDER — LACTATED RINGERS IV SOLN: INTRAVENOUS | Status: DC

## 2022-06-15 SURGICAL SUPPLY — 11 items
CANISTER SUCT 1200ML W/VALVE (MISCELLANEOUS) ×1 IMPLANT
GLOVE BIO SURGEON STRL SZ7.5 (GLOVE) ×1 IMPLANT
KIT CLEAN ENDO (MISCELLANEOUS) ×1 IMPLANT
NDL HYPO 27GX1-1/4 (NEEDLE) IMPLANT
NEEDLE HYPO 27GX1-1/4 (NEEDLE) IMPLANT
PATTIES SURGICAL .5 X3 (DISPOSABLE) ×1 IMPLANT
SHEET MEDIUM DRAPE 40X70 STRL (DRAPES) ×1 IMPLANT
SOL ANTI FOG 6CC (MISCELLANEOUS) ×1 IMPLANT
SYR CONTROL 10ML LL (SYRINGE) IMPLANT
TOWEL GREEN STERILE FF (TOWEL DISPOSABLE) ×1 IMPLANT
TUBE CONNECTING 20X1/4 (TUBING) ×1 IMPLANT

## 2022-06-15 NOTE — Discharge Instructions (Signed)

## 2022-06-15 NOTE — Brief Op Note (Signed)
06/15/2022  12:14 PM  PATIENT:  Darlene Mcdowell  72 y.o. female  PRE-OPERATIVE DIAGNOSIS:  BMI 27.0-27.9,adult Obstructive Sleep Apnea  POST-OPERATIVE DIAGNOSIS:  same  PROCEDURE:  Procedure(s): DRUG INDUCED SLEEP  ENDOSCOPY (Bilateral)  SURGEON:  Surgeon(s) and Role:    Christia Reading, MD - Primary  PHYSICIAN ASSISTANT:   ASSISTANTS: none   ANESTHESIA:   IV sedation  EBL:  None   BLOOD ADMINISTERED:none  DRAINS: none   LOCAL MEDICATIONS USED:  NONE  SPECIMEN:  No Specimen  DISPOSITION OF SPECIMEN:  N/A  COUNTS:  YES  TOURNIQUET:  * No tourniquets in log *  DICTATION: .Note written in EPIC  PLAN OF CARE: Discharge to home after PACU  PATIENT DISPOSITION:  PACU - hemodynamically stable.   Delay start of Pharmacological VTE agent (>24hrs) due to surgical blood loss or risk of bleeding: no

## 2022-06-15 NOTE — Op Note (Signed)
Preop diagnosis: Obstructive sleep apnea Postop diagnosis: same Procedure: Drug-induced sleep endoscopy Surgeon: Dafina Suk Anesth: IV sedation Compl: None Findings: There is 75% anterior-posterior collapse at the velum making her a candidate for hypoglossal nerve stimulator placement.  There was also marked anterior-posterior collapse at the tongue base. Description:  After discussing risks, benefits, and alternatives, the patient was brought to the operative suite and placed on the operative table in the supine position.  Anesthesia was induced and the patient was given light sedation to simulate natural sleep. When the proper level was reached, an Afrin-soaked pledget was placed in the right nasal passage for a couple of minutes and then removed.  The fiberoptic laryngoscope was then passed to view the pharynx and larynx.  Findings are noted above and the exam was recorded.  After completion, the scope was removed and the patient was returned to anesthesia for wakeup and was moved to the recovery room in stable condition. 

## 2022-06-15 NOTE — Transfer of Care (Addendum)
Immediate Anesthesia Transfer of Care Note  Patient: Darlene Mcdowell  Procedure(s) Performed: DRUG INDUCED SLEEP  ENDOSCOPY (Bilateral)  Patient Location: PACU  Anesthesia Type:General  Level of Consciousness: awake, alert , and oriented  Airway & Oxygen Therapy: Patient Spontanous Breathing and Patient connected to nasal cannula oxygen  Post-op Assessment: Report given to RN and Post -op Vital signs reviewed and stable  Post vital signs: Reviewed and stable  Last Vitals:  Vitals Value Taken Time  BP 114/65   Temp    Pulse 65 06/15/22 1220  Resp 20 06/15/22 1220  SpO2 97 % 06/15/22 1220  Vitals shown include unvalidated device data.  Last Pain:  Vitals:   06/15/22 1122  TempSrc: Oral  PainSc: 0-No pain      Patients Stated Pain Goal: 3 (06/15/22 1122)  Complications: No notable events documented.

## 2022-06-15 NOTE — H&P (Signed)
Darlene Mcdowell is an 72 y.o. female.   Chief Complaint: Sleep apnea HPI: 72 year old female with sleep apnea who has not been able to tolerate CPAP.  Past Medical History:  Diagnosis Date   Anxiety    Cancer ([facility])    skin   Diabetes mellitus without complication ([facility])    Diet controlled   GERD (gastroesophageal reflux disease)    Hyperlipidemia    Hypertension    Hypothyroidism    Motion sickness    PONV (postoperative nausea and vomiting)    Sciatica of right side    Spinal stenosis of lumbar region    Thyroid disease    under active thyroid problems   Vitamin D deficiency     Past Surgical History:  Procedure Laterality Date   CATARACT EXTRACTION W/PHACO Left 06/30/2021   Procedure: CATARACT EXTRACTION PHACO AND INTRAOCULAR LENS PLACEMENT (IOC) LEFT 6.29 00:38.4;  Surgeon: Galen Manila, MD;  Location: [facility];  Service: Ophthalmology;  Laterality: Left;  mid morning or late arrival   CATARACT EXTRACTION W/PHACO Right 12/01/2021   Procedure: CATARACT EXTRACTION PHACO AND INTRAOCULAR LENS PLACEMENT (IOC) RIGHT  5.25  00:34.2;  Surgeon: Galen Manila, MD;  Location: [facility];  Service: Ophthalmology;  Laterality: Right;   LUMBAR L[facility]INE[facility]OMY  2021   [facility]   SPINE SURGERY  01/12/2003   cervical c-7   TUBAL LIGATION  01/12/1979    Family History  Problem Relation Age of Onset   Stroke Mother    Hypertension Mother    Hyperlipidemia Father    Heart disease Father    Hypertension Father    Cancer Maternal Grandmother    Heart disease Maternal Grandfather    Heart disease Paternal Grandmother    Breast cancer Neg Hx    Social History:  reports that she quit smoking about 19 years ago. Her smoking use included cigarettes. She has a 15.00 pack-year smoking history. She has never used smokeless tobacco. She reports current alcohol use. She reports that she does not use drugs.  Allergies:  Allergies  Allergen Reactions    Hydrocodone Other (See Comments)   Oxycodone Nausea And Vomiting   Pantoprazole     GI issues   Tizanidine     GI issues   Wellbutrin [Bupropion] Other (See Comments)    Ear pain    Medications Prior to Admission  Medication Sig Dispense Refill   ALPRAZolam (XANAX) 0.25 MG tablet Take 1 tablet by mouth once daily as needed for panic attacks 10 tablet 0   aspirin 81 MG chewable tablet Chew 81 mg by mouth daily.     Coenzyme Q10 (COQ10 PO) Take 200 mg by mouth daily.     diphenhydramine-acetaminophen (TYLENOL PM) 25-500 MG TABS tablet Take 1 tablet by mouth at bedtime as needed.     gabapentin (NEURONTIN) 100 MG capsule TAKE 1 CAPSULE BY MOUTH EVERY MORNING AND 2 CAPSULES EVERY NIGHT AT BEDTIME     levothyroxine (SYNTHROID, LEVOTHROID) 100 MCG tablet TAKE 1 TABLET BY MOUTH EVERY DAY 30 tablet 0   Menthol-Methyl Salicylate (SALONPAS PAIN RELIEF PATCH EX) Apply topically daily as needed.     omeprazole (PRILOSEC) 20 MG capsule TAKE 1 CAPSULE BY MOUTH EVERY DAY 90 capsule 1   rosuvastatin (CRESTOR) 10 MG tablet Take 10 mg by mouth daily.     valsartan (DIOVAN) 320 MG tablet Take 320 mg by mouth daily.     ACCU-CHEK GUIDE test strip USE TO TEST 1 (  ONE) EACH FOR ONCE DAILY GLUCOSE MONITORING     Accu-Chek Softclix Lancets lancets SMARTSIG:1 Each Topical As Directed     Homeopathic Products (LEG CRAMP RELIEF PO) Take 2 tablets by mouth daily as needed.     methocarbamol (ROBAXIN) 500 MG tablet Take 1 tablet (500 mg total) by mouth 4 (four) times daily. (Patient not taking: Reported on 11/25/2021) 28 tablet 0   ondansetron (ZOFRAN-ODT) 4 MG disintegrating tablet Take 1 tablet (4 mg total) by mouth every 8 (eight) hours as needed for nausea or vomiting. (Patient not taking: Reported on 11/25/2021) 20 tablet 0    No results found for this or any previous visit (from the past 48 hour(s)). No results found.  Review of Systems  All other systems reviewed and are negative.   Blood pressure  128/67, pulse 76, temperature (!) 97.1 F (36.2 C), temperature source Oral, resp. rate 16, height 5\' 7"  (1.702 m), weight 78.4 kg, last menstrual period 01/11/1994, SpO2 100 %. Physical Exam Constitutional:      Appearance: Normal appearance. She is normal weight.  HENT:     Head: Normocephalic and atraumatic.     Right Ear: External ear normal.     Left Ear: External ear normal.     Nose: Nose normal.     Mouth/Throat:     Mouth: Mucous membranes are moist.     Pharynx: Oropharynx is clear.  Eyes:     Extraocular Movements: Extraocular movements intact.     Conjunctiva/sclera: Conjunctivae normal.     Pupils: Pupils are equal, round, and reactive to light.  Cardiovascular:     Rate and Rhythm: Normal rate.  Pulmonary:     Effort: Pulmonary effort is normal.  Musculoskeletal:     Cervical back: Normal range of motion.  Skin:    General: Skin is warm and dry.  Neurological:     General: No focal deficit present.     Mental Status: She is alert and oriented to person, place, and time.  Psychiatric:        Mood and Affect: Mood normal.        Behavior: Behavior normal.        Thought Content: Thought content normal.        Judgment: Judgment normal.      Assessment/Plan Obstructive sleep apnea and BMI 27.07.  To OR for sleep endoscopy.  Christia Reading, MD 06/15/2022, 11:52 AM

## 2022-06-15 NOTE — Anesthesia Postprocedure Evaluation (Signed)
Anesthesia Post Note  Patient: Darlene Mcdowell  Procedure(s) Performed: DRUG INDUCED SLEEP  ENDOSCOPY (Bilateral)     Patient location during evaluation: PACU Anesthesia Type: General Level of consciousness: awake and alert and oriented Pain management: pain level controlled Vital Signs Assessment: post-procedure vital signs reviewed and stable Respiratory status: spontaneous breathing, nonlabored ventilation and respiratory function stable Cardiovascular status: blood pressure returned to baseline and stable Postop Assessment: no apparent nausea or vomiting Anesthetic complications: no   No notable events documented.  Last Vitals:  Vitals:   06/15/22 1229 06/15/22 1230  BP:    Pulse: 65 66  Resp: 12 14  Temp:    SpO2: 95% 95%    Last Pain:  Vitals:   06/15/22 1221  TempSrc:   PainSc: 0-No pain                 Sherria Riemann A.

## 2022-06-16 ENCOUNTER — Encounter (HOSPITAL_BASED_OUTPATIENT_CLINIC_OR_DEPARTMENT_OTHER): Payer: Self-pay | Admitting: Otolaryngology

## 2022-06-28 ENCOUNTER — Other Ambulatory Visit: Payer: Self-pay | Admitting: Otolaryngology

## 2022-07-27 ENCOUNTER — Encounter (HOSPITAL_COMMUNITY): Payer: Self-pay

## 2022-07-27 NOTE — Pre-Procedure Instructions (Signed)
Surgical Instructions   Your procedure is scheduled on Monday, July 22nd. Report to [facility] Main Entrance "A" at 11:00 A.M., then check in with the Admitting office. Any questions or running late day of surgery: call [phone]  Questions prior to your surgery date: call [phone], Monday-Friday, 8am-4pm. If you experience any cold or flu symptoms such as cough, fever, chills, shortness of breath, etc. between now and your scheduled surgery, please notify us at the above number.     Remember:  Do not eat after midnight the night before your surgery   You may drink clear liquids until 10:00 AM the morning of your surgery.   Clear liquids allowed are: Water, Non-Citrus Juices (without pulp), Carbonated Beverages, Clear Tea, Black Coffee Only (NO MILK, CREAM OR POWDERED CREAMER of any kind), and Gatorade.    Take these medicines the morning of surgery with A SIP OF WATER  ALPRAZolam (XANAX)  gabapentin (NEURONTIN)  levothyroxine (SYNTHROID, LEVOTHROID)  omeprazole (PRILOSEC)  rosuvastatin (CRESTOR)    May take these medicines IF NEEDED: acetaminophen (TYLENOL)    Follow your surgeon's instructions on when to stop Aspirin.  If no instructions were given by your surgeon then you will need to call the office to get those instructions.     One week prior to surgery, STOP taking any Aleve, diclofenac Sodium (VOLTAREN) gel, Naproxen, Ibuprofen, Motrin, Advil, Goody's, BC's, all herbal medications, fish oil, and non-prescription vitamins.                     Do NOT Smoke (Tobacco/Vaping) for 24 hours prior to your procedure.  If you use a CPAP at night, you may bring your mask/headgear for your overnight stay.   You will be asked to remove any contacts, glasses, piercing's, hearing aid's, dentures/partials prior to surgery. Please bring cases for these items if needed.    Patients discharged the day of surgery will not be allowed to drive home, and someone needs to stay with  them for 24 hours.  SURGICAL WAITING ROOM VISITATION Patients may have no more than 2 support people in the waiting area - these visitors may rotate.   Pre-op nurse will coordinate an appropriate time for 1 ADULT support person, who may not rotate, to accompany patient in pre-op.  Children under the age of 1 must have an adult with them who is not the patient and must remain in the main waiting area with an adult.  If the patient needs to stay at the hospital during part of their recovery, the visitor guidelines for inpatient rooms apply.  Please refer to the [facility] website for the visitor guidelines for any additional information.   If you received a COVID test during your pre-op visit  it is requested that you wear a mask when out in public, stay away from anyone that may not be feeling well and notify your surgeon if you develop symptoms. If you have been in contact with anyone that has tested positive in the last 10 days please notify you surgeon.      Pre-operative CHG Bathing Instructions   You can play a key role in reducing the risk of infection after surgery. Your skin needs to be as free of germs as possible. You can reduce the number of germs on your skin by washing with CHG (chlorhexidine gluconate) soap before surgery. CHG is an antiseptic soap that kills germs and continues to kill germs even after washing.   DO NOT use  if you have an allergy to chlorhexidine/CHG or antibacterial soaps. If your skin becomes reddened or irritated, stop using the CHG and notify one of our RNs at [phone].                 TAKE A SHOWER THE NIGHT BEFORE SURGERY AND THE DAY OF SURGERY    Please keep in mind the following:  DO NOT shave, including legs and underarms, 48 hours prior to surgery.   You may shave your face before/day of surgery.  Place clean sheets on your bed the night before surgery Use a clean washcloth (not used since being washed) for each shower. DO NOT sleep with  pet's night before surgery.  CHG Shower Instructions:  If you choose to wash your hair and private area, wash first with your normal shampoo/soap.  After you use shampoo/soap, rinse your hair and body thoroughly to remove shampoo/soap residue.  Turn the water OFF and apply half the bottle of CHG soap to a CLEAN washcloth.  Apply CHG soap ONLY FROM YOUR NECK DOWN TO YOUR TOES (washing for 3-5 minutes)  DO NOT use CHG soap on face, private areas, open wounds, or sores.  Pay special attention to the area where your surgery is being performed.  If you are having back surgery, having someone wash your back for you may be helpful. Wait 2 minutes after CHG soap is applied, then you may rinse off the CHG soap.  Pat dry with a clean towel  Put on clean pajamas    Additional instructions for the day of surgery: DO NOT APPLY any lotions, deodorants, cologne, or perfumes.   Do not wear jewelry or makeup Do not wear nail polish, gel polish, artificial nails, or any other type of covering on natural nails (fingers and toes) Do not bring valuables to the hospital. [facility] is not responsible for valuables/personal belongings. Put on clean/comfortable clothes.  Please brush your teeth.  Ask your nurse before applying any prescription medications to the skin.

## 2022-07-28 ENCOUNTER — Encounter (HOSPITAL_COMMUNITY)
Admission: RE | Admit: 2022-07-28 | Discharge: 2022-07-28 | Disposition: A | Discharge status: Home / Self Care | Payer: Medicare Other | Source: Ambulatory Visit | Attending: Otolaryngology | Admitting: Otolaryngology

## 2022-07-28 ENCOUNTER — Other Ambulatory Visit: Payer: Self-pay

## 2022-07-28 ENCOUNTER — Encounter (HOSPITAL_COMMUNITY): Payer: Self-pay

## 2022-07-28 VITALS — BP 155/68 | HR 78 | Temp 98.1°F | Resp 17 | Ht 63.0 in | Wt 169.0 lb

## 2022-07-28 DIAGNOSIS — Z01812 Encounter for preprocedural laboratory examination: Secondary | ICD-10-CM | POA: Diagnosis not present

## 2022-07-28 DIAGNOSIS — E119 Type 2 diabetes mellitus without complications: Secondary | ICD-10-CM | POA: Insufficient documentation

## 2022-07-28 DIAGNOSIS — Z01818 Encounter for other preprocedural examination: Secondary | ICD-10-CM

## 2022-07-28 HISTORY — DX: Sleep apnea, unspecified: G47.30

## 2022-07-28 HISTORY — DX: Unspecified osteoarthritis, unspecified site: M19.90

## 2022-07-28 LAB — BASIC METABOLIC PANEL
Anion gap: 8 (ref 5–15)
BUN: 23 mg/dL (ref 8–23)
CO2: 23 mmol/L (ref 22–32)
Calcium: 10.2 mg/dL (ref 8.9–10.3)
Chloride: 104 mmol/L (ref 98–111)
Creatinine, Ser: 0.82 mg/dL (ref 0.44–1.00)
GFR, Estimated: >60 mL/min (ref >60)
Glucose, Bld: 110 mg/dL — ABNORMAL HIGH (ref 70–99)
Potassium: 4.3 mmol/L (ref 3.5–5.1)
Sodium: 135 mmol/L (ref 135–145)

## 2022-07-28 LAB — CBC
HCT: 41.3 % (ref 36.0–46.0)
Hemoglobin: 14 g/dL (ref 12.0–15.0)
MCH: 31.2 pg (ref 26.0–34.0)
MCHC: 33.9 g/dL (ref 30.0–36.0)
MCV: 92 fL (ref 80.0–100.0)
Platelets: 188 10*3/uL (ref 150–400)
RBC: 4.49 MIL/uL (ref 3.87–5.11)
RDW: 12.6 % (ref 11.5–15.5)
WBC: 9.7 10*3/uL (ref 4.0–10.5)
nRBC: 0 % (ref 0.0–0.2)

## 2022-07-28 LAB — GLUCOSE, CAPILLARY: Glucose-Capillary: 106 mg/dL — ABNORMAL HIGH (ref 70–99)

## 2022-07-28 NOTE — Progress Notes (Signed)
PCP - Lonie Peak, PA-C Cardiologist - denies  PPM/ICD - denies   Chest x-ray - 09/09/17 EKG - 06/10/22 Stress Test - denies ECHO - denies Cardiac Cath - denies  Sleep Study - OSA+ CPAP - some nights, pt states she tries to use it nightly but is unable to tolerate some nights (She thinks the pressure setting is a 4)  Fasting Blood Sugar - 90-110 Checks Blood Sugar once a day  Last dose of GLP1 agonist-  n/a   Blood Thinner Instructions: n/a Aspirin Instructions: f/u with surgeon  ERAS Protcol - yes, no drink   COVID TEST- n/a   Anesthesia review: no  Patient denies shortness of breath, fever, cough and chest pain at PAT appointment   All instructions explained to the patient, with a verbal understanding of the material. Patient agrees to go over the instructions while at home for a better understanding.  The opportunity to ask questions was provided.

## 2022-07-29 ENCOUNTER — Encounter (HOSPITAL_BASED_OUTPATIENT_CLINIC_OR_DEPARTMENT_OTHER): Payer: Self-pay | Admitting: Otolaryngology

## 2022-07-29 LAB — HEMOGLOBIN A1C
Hgb A1c MFr Bld: 6 % — ABNORMAL HIGH (ref 4.8–5.6)
Mean Plasma Glucose: 126 mg/dL

## 2022-08-02 ENCOUNTER — Encounter (HOSPITAL_BASED_OUTPATIENT_CLINIC_OR_DEPARTMENT_OTHER)
Admission: RE | Disposition: A | Discharge status: Home / Self Care | Payer: Self-pay | Source: Home / Self Care | Attending: Otolaryngology

## 2022-08-02 ENCOUNTER — Ambulatory Visit (HOSPITAL_BASED_OUTPATIENT_CLINIC_OR_DEPARTMENT_OTHER): Payer: Medicare Other | Admitting: Anesthesiology

## 2022-08-02 ENCOUNTER — Other Ambulatory Visit: Payer: Self-pay

## 2022-08-02 ENCOUNTER — Ambulatory Visit (HOSPITAL_COMMUNITY): Payer: Medicare Other

## 2022-08-02 ENCOUNTER — Ambulatory Visit (HOSPITAL_BASED_OUTPATIENT_CLINIC_OR_DEPARTMENT_OTHER)
Admission: RE | Admit: 2022-08-02 | Discharge: 2022-08-02 | Disposition: A | Discharge status: Home / Self Care | Payer: Medicare Other | Source: Home / Self Care | Attending: Otolaryngology | Admitting: Otolaryngology

## 2022-08-02 ENCOUNTER — Encounter (HOSPITAL_BASED_OUTPATIENT_CLINIC_OR_DEPARTMENT_OTHER): Payer: Self-pay | Admitting: Otolaryngology

## 2022-08-02 DIAGNOSIS — E663 Overweight: Secondary | ICD-10-CM | POA: Insufficient documentation

## 2022-08-02 DIAGNOSIS — E119 Type 2 diabetes mellitus without complications: Secondary | ICD-10-CM

## 2022-08-02 DIAGNOSIS — I10 Essential (primary) hypertension: Secondary | ICD-10-CM

## 2022-08-02 DIAGNOSIS — Z87891 Personal history of nicotine dependence: Secondary | ICD-10-CM | POA: Diagnosis not present

## 2022-08-02 DIAGNOSIS — G4733 Obstructive sleep apnea (adult) (pediatric): Secondary | ICD-10-CM | POA: Diagnosis present

## 2022-08-02 DIAGNOSIS — Z6826 Body mass index (BMI) 26.0-26.9, adult: Secondary | ICD-10-CM | POA: Diagnosis not present

## 2022-08-02 HISTORY — PX: IMPLANTATION OF HYPOGLOSSAL NERVE STIMULATOR: SHX6827

## 2022-08-02 HISTORY — DX: Prediabetes: R73.03

## 2022-08-02 SURGERY — INSERTION, HYPOGLOSSAL NERVE STIMULATOR
Anesthesia: General | Site: Neck | Laterality: Right

## 2022-08-02 MED ORDER — LIDOCAINE 2% (20 MG/ML) 5 ML SYRINGE
INTRAMUSCULAR | Status: AC
  Filled 2022-08-02: qty 5

## 2022-08-02 MED ORDER — AMISULPRIDE (ANTIEMETIC) 5 MG/2ML IV SOLN: 10.0000 mg | Freq: Once | INTRAVENOUS | Status: DC | PRN

## 2022-08-02 MED ORDER — SUCCINYLCHOLINE CHLORIDE 200 MG/10ML IV SOSY
PREFILLED_SYRINGE | INTRAVENOUS | Status: DC | PRN
  Administered 2022-08-02: 120 mg via INTRAVENOUS

## 2022-08-02 MED ORDER — FENTANYL CITRATE (PF) 100 MCG/2ML IJ SOLN: 25.0000 ug | INTRAMUSCULAR | Status: DC | PRN

## 2022-08-02 MED ORDER — CEFAZOLIN SODIUM-DEXTROSE 2-4 GM/100ML-% IV SOLN
2.0000 g | INTRAVENOUS | Status: AC
  Administered 2022-08-02: 2 g via INTRAVENOUS

## 2022-08-02 MED ORDER — 0.9 % SODIUM CHLORIDE (POUR BTL) OPTIME
TOPICAL | Status: DC | PRN
  Administered 2022-08-02: 120 mL

## 2022-08-02 MED ORDER — ONDANSETRON HCL 4 MG/2ML IJ SOLN
INTRAMUSCULAR | Status: DC | PRN
  Administered 2022-08-02: 4 mg via INTRAVENOUS

## 2022-08-02 MED ORDER — LACTATED RINGERS IV SOLN: INTRAVENOUS | Status: DC

## 2022-08-02 MED ORDER — CHLORHEXIDINE GLUCONATE 0.12 % MT SOLN: 15.0000 mL | Freq: Once | OROMUCOSAL | Status: DC

## 2022-08-02 MED ORDER — ONDANSETRON HCL 4 MG/2ML IJ SOLN: 4.0000 mg | Freq: Once | INTRAMUSCULAR | Status: DC | PRN

## 2022-08-02 MED ORDER — DEXAMETHASONE SODIUM PHOSPHATE 4 MG/ML IJ SOLN
INTRAMUSCULAR | Status: DC | PRN
  Administered 2022-08-02: 10 mg via INTRAVENOUS

## 2022-08-02 MED ORDER — ONDANSETRON HCL 4 MG/2ML IJ SOLN
INTRAMUSCULAR | Status: AC
  Filled 2022-08-02: qty 2

## 2022-08-02 MED ORDER — DEXAMETHASONE SODIUM PHOSPHATE 10 MG/ML IJ SOLN
INTRAMUSCULAR | Status: AC
  Filled 2022-08-02: qty 1

## 2022-08-02 MED ORDER — ORAL CARE MOUTH RINSE: 15.0000 mL | Freq: Once | OROMUCOSAL | Status: DC

## 2022-08-02 MED ORDER — FENTANYL CITRATE (PF) 100 MCG/2ML IJ SOLN
INTRAMUSCULAR | Status: DC | PRN
  Administered 2022-08-02 (×2): 50 ug via INTRAVENOUS

## 2022-08-02 MED ORDER — PROPOFOL 500 MG/50ML IV EMUL
INTRAVENOUS | Status: DC | PRN
  Administered 2022-08-02: 100 ug/kg/min via INTRAVENOUS

## 2022-08-02 MED ORDER — LIDOCAINE-EPINEPHRINE 1 %-1:100000 IJ SOLN
INTRAMUSCULAR | Status: DC | PRN
  Administered 2022-08-02: 5.5 mL

## 2022-08-02 MED ORDER — PROPOFOL 10 MG/ML IV BOLUS
INTRAVENOUS | Status: DC | PRN
  Administered 2022-08-02: 150 mg via INTRAVENOUS

## 2022-08-02 MED ORDER — LIDOCAINE 2% (20 MG/ML) 5 ML SYRINGE
INTRAMUSCULAR | Status: DC | PRN
  Administered 2022-08-02: 60 mg via INTRAVENOUS

## 2022-08-02 MED ORDER — FENTANYL CITRATE (PF) 100 MCG/2ML IJ SOLN
INTRAMUSCULAR | Status: AC
  Filled 2022-08-02: qty 2

## 2022-08-02 MED ORDER — PHENYLEPHRINE HCL (PRESSORS) 10 MG/ML IV SOLN
INTRAVENOUS | Status: DC | PRN
  Administered 2022-08-02 (×2): 80 ug via INTRAVENOUS

## 2022-08-02 MED ORDER — EPHEDRINE SULFATE (PRESSORS) 50 MG/ML IJ SOLN
INTRAMUSCULAR | Status: DC | PRN
  Administered 2022-08-02: 10 mg via INTRAVENOUS

## 2022-08-02 MED ORDER — DIPHENHYDRAMINE HCL 50 MG/ML IJ SOLN: 12.5000 mg | Freq: Once | INTRAMUSCULAR | Status: DC

## 2022-08-02 SURGICAL SUPPLY — 69 items
ACC NRSTM 4 TRQ WRNCH STRL (MISCELLANEOUS)
ADH SKN CLS APL DERMABOND .7 (GAUZE/BANDAGES/DRESSINGS) ×2
BLADE CLIPPER SURG (BLADE) IMPLANT
BLADE SURG 15 STRL LF DISP TIS (BLADE) ×1 IMPLANT
BLADE SURG 15 STRL SS (BLADE) ×1
CANISTER SUCT 1200ML W/VALVE (MISCELLANEOUS) ×1 IMPLANT
CORD BIPOLAR FORCEPS 12FT (ELECTRODE) ×1 IMPLANT
COVER PROBE CYLINDRICAL 5X96 (MISCELLANEOUS) ×1 IMPLANT
DERMABOND ADVANCED .7 DNX12 (GAUZE/BANDAGES/DRESSINGS) ×2 IMPLANT
DRAPE C-ARM 35X43 STRL (DRAPES) ×1 IMPLANT
DRAPE HEAD BAR (DRAPES) IMPLANT
DRAPE INCISE IOBAN 66X45 STRL (DRAPES) ×1 IMPLANT
DRAPE MICROSCOPE WILD 40.5X102 (DRAPES) ×1 IMPLANT
DRAPE UTILITY XL STRL (DRAPES) ×1 IMPLANT
DRSG TEGADERM 2-3/8X2-3/4 SM (GAUZE/BANDAGES/DRESSINGS) ×2 IMPLANT
DRSG TEGADERM 4X4.75 (GAUZE/BANDAGES/DRESSINGS) IMPLANT
ELECT COATED BLADE 2.86 ST (ELECTRODE) ×1 IMPLANT
ELECT EMG 18 NIMS (NEUROSURGERY SUPPLIES) ×1
ELECT REM PT RETURN 9FT ADLT (ELECTROSURGICAL) ×1
ELECTRODE EMG 18 NIMS (NEUROSURGERY SUPPLIES) ×1 IMPLANT
ELECTRODE REM PT RTRN 9FT ADLT (ELECTROSURGICAL) ×1 IMPLANT
FORCEPS BIPOLAR SPETZLER 8 1.0 (NEUROSURGERY SUPPLIES) ×1 IMPLANT
GAUZE 4X4 16PLY ~~LOC~~+RFID DBL (SPONGE) ×1 IMPLANT
GAUZE SPONGE 4X4 12PLY STRL (GAUZE/BANDAGES/DRESSINGS) ×1 IMPLANT
GENERATOR PULSE INSPIRE (Generator) ×1 IMPLANT
GENERATOR PULSE INSPIRE IV (Generator) ×1 IMPLANT
GLOVE BIO SURGEON STRL SZ 6.5 (GLOVE) IMPLANT
GLOVE BIO SURGEON STRL SZ7.5 (GLOVE) ×1 IMPLANT
GLOVE BIOGEL PI IND STRL 7.0 (GLOVE) IMPLANT
GLOVE ECLIPSE 6.5 STRL STRAW (GLOVE) IMPLANT
GOWN STRL REUS W/ TWL LRG LVL3 (GOWN DISPOSABLE) ×3 IMPLANT
GOWN STRL REUS W/TWL LRG LVL3 (GOWN DISPOSABLE) ×3
IV CATH 18G SAFETY (IV SOLUTION) ×1 IMPLANT
KIT NEURO ACCESSORY W/WRENCH (MISCELLANEOUS) IMPLANT
LEAD SENSING RESP INSPIRE (Lead) ×1 IMPLANT
LEAD SENSING RESP INSPIRE IV (Lead) ×1 IMPLANT
LEAD SLEEP STIM INSPIRE IV/V (Lead) ×1 IMPLANT
LEAD SLEEP STIMULATION INSPIRE (Lead) ×1 IMPLANT
LOOP VASCLR MAXI BLUE 18IN ST (MISCELLANEOUS) ×1 IMPLANT
LOOP VASCULAR MAXI 18 BLUE (MISCELLANEOUS) ×1
LOOP VASCULAR MINI 18 RED (MISCELLANEOUS) ×1
MARKER SKIN DUAL TIP RULER LAB (MISCELLANEOUS) ×1 IMPLANT
NDL HYPO 25X1 1.5 SAFETY (NEEDLE) ×1 IMPLANT
NEEDLE HYPO 25X1 1.5 SAFETY (NEEDLE) ×1
NS IRRIG 1000ML POUR BTL (IV SOLUTION) ×1 IMPLANT
PACK BASIN DAY SURGERY FS (CUSTOM PROCEDURE TRAY) ×1 IMPLANT
PACK ENT DAY SURGERY (CUSTOM PROCEDURE TRAY) ×1 IMPLANT
PASSER CATH 36 CODMAN DISP (NEUROSURGERY SUPPLIES) IMPLANT
PASSER CATH 38CM DISP (INSTRUMENTS) IMPLANT
PENCIL SMOKE EVACUATOR (MISCELLANEOUS) ×1 IMPLANT
PROBE NERVE STIMULATOR (NEUROSURGERY SUPPLIES) ×1 IMPLANT
REMOTE CONTROL SLEEP INSPIRE (MISCELLANEOUS) ×1 IMPLANT
SET WALTER ACTIVATION W/DRAPE (SET/KITS/TRAYS/PACK) ×1 IMPLANT
SLEEVE SCD COMPRESS KNEE MED (STOCKING) ×1 IMPLANT
SPONGE INTESTINAL PEANUT (DISPOSABLE) ×1 IMPLANT
SUT SILK 2 0 SH (SUTURE) ×1 IMPLANT
SUT SILK 3 0 REEL (SUTURE) ×1 IMPLANT
SUT SILK 3 0 SH 30 (SUTURE) ×1 IMPLANT
SUT SILK 3-0 (SUTURE) ×1
SUT SILK 3-0 RB1 30XBRD (SUTURE) ×1
SUT VIC AB 3-0 SH 27 (SUTURE) ×2
SUT VIC AB 3-0 SH 27X BRD (SUTURE) ×2 IMPLANT
SUT VIC AB 4-0 PS2 27 (SUTURE) ×2 IMPLANT
SUTURE SILK 3-0 RB1 30XBRD (SUTURE) ×1 IMPLANT
SYR 10ML LL (SYRINGE) ×1 IMPLANT
SYR BULB EAR ULCER 3OZ GRN STR (SYRINGE) ×1 IMPLANT
TOWEL GREEN STERILE FF (TOWEL DISPOSABLE) ×2 IMPLANT
VASCULAR TIE MAXI BLUE 18IN ST (MISCELLANEOUS) ×1
VASCULAR TIE MINI RED 18IN STL (MISCELLANEOUS) ×1 IMPLANT

## 2022-08-02 NOTE — Anesthesia Procedure Notes (Signed)
Procedure Name: Intubation Date/Time: 08/02/2022 1:20 PM  Performed by: Burna Cash, CRNAPre-anesthesia Checklist: Patient identified, Emergency Drugs available, Suction available and Patient being monitored Patient Re-evaluated:Patient Re-evaluated prior to induction Oxygen Delivery Method: Circle system utilized Preoxygenation: Pre-oxygenation with 100% oxygen Induction Type: IV induction Ventilation: Mask ventilation without difficulty Laryngoscope Size: Mac and 3 Grade View: Grade I Tube type: Oral Tube size: 7.0 mm Number of attempts: 1 Airway Equipment and Method: Stylet and Oral airway Placement Confirmation: ETT inserted through vocal cords under direct vision, positive ETCO2 and breath sounds checked- equal and bilateral Secured at: 21 cm Tube secured with: Tape Dental Injury: Teeth and Oropharynx as per pre-operative assessment

## 2022-08-02 NOTE — Anesthesia Postprocedure Evaluation (Signed)
Anesthesia Post Note  Patient: Darlene Mcdowell  Procedure(s) Performed: IMPLANTATION OF HYPOGLOSSAL NERVE STIMULATOR (Right: Neck)     Patient location during evaluation: PACU Anesthesia Type: General Level of consciousness: awake and alert and oriented Pain management: pain level controlled Vital Signs Assessment: post-procedure vital signs reviewed and stable Respiratory status: spontaneous breathing, nonlabored ventilation and respiratory function stable Cardiovascular status: blood pressure returned to baseline and stable Postop Assessment: no apparent nausea or vomiting Anesthetic complications: no   No notable events documented.  Last Vitals:  Vitals:   08/02/22 1530 08/02/22 1545  BP: 131/63 (!) 147/69  Pulse: 86 85  Resp: (!) 21 (!) 26  Temp:    SpO2: 96% 100%    Last Pain:  Vitals:   08/02/22 1530  TempSrc:   PainSc: 0-No pain                 Kenidee Cregan A.

## 2022-08-02 NOTE — Op Note (Signed)

## 2022-08-02 NOTE — Discharge Instructions (Signed)

## 2022-08-02 NOTE — Brief Op Note (Signed)
08/02/2022  2:48 PM  PATIENT:  Darlene Mcdowell  72 y.o. female  PRE-OPERATIVE DIAGNOSIS:  BMI 27.0-27.9,adult Obstructive Sleep Apnea  POST-OPERATIVE DIAGNOSIS:  Obstructive Sleep Apnea  PROCEDURE:  Procedure(s): IMPLANTATION OF HYPOGLOSSAL NERVE STIMULATOR (Right)  SURGEON:  Surgeons and Role:    Christia Reading, MD - Primary  PHYSICIAN ASSISTANT:   ASSISTANTS: RNFA   ANESTHESIA:   general  EBL:  30 mL   BLOOD ADMINISTERED:none  DRAINS: none   LOCAL MEDICATIONS USED:  LIDOCAINE   SPECIMEN:  No Specimen  DISPOSITION OF SPECIMEN:  N/A  COUNTS:  YES  TOURNIQUET:  * No tourniquets in log *  DICTATION: .Note written in EPIC  PLAN OF CARE: Discharge to home after PACU  PATIENT DISPOSITION:  PACU - hemodynamically stable.   Delay start of Pharmacological VTE agent (>24hrs) due to surgical blood loss or risk of bleeding: no

## 2022-08-02 NOTE — Anesthesia Preprocedure Evaluation (Addendum)
Anesthesia Evaluation  Patient identified by MRN, date of birth, ID band Patient awake    Reviewed: Allergy & Precautions, NPO status , Patient's Chart, lab work & pertinent test results, reviewed documented beta blocker date and time   History of Anesthesia Complications (+) PONV and history of anesthetic complications  Airway Mallampati: II       Dental no notable dental hx. (+) Teeth Intact   Pulmonary sleep apnea , former smoker   Pulmonary exam normal breath sounds clear to auscultation       Cardiovascular hypertension, Pt. on medications Normal cardiovascular exam Rhythm:Regular Rate:Normal     Neuro/Psych  PSYCHIATRIC DISORDERS Anxiety      Neuromuscular disease    GI/Hepatic Neg liver ROS,GERD  Medicated,Patient did not received Oral Contrast Agents,  Endo/Other  diabetes, Well Controlled, Type 2Hypothyroidism  Hyperlipidemia  Renal/GU negative Renal ROS  negative genitourinary   Musculoskeletal  (+) Arthritis , Osteoarthritis,    Abdominal   Peds  Hematology   Anesthesia Other Findings   Reproductive/Obstetrics                              Anesthesia Physical Anesthesia Plan  ASA: 3  Anesthesia Plan: General   Post-op Pain Management: Minimal or no pain anticipated and Dilaudid IV   Induction: Intravenous  PONV Risk Score and Plan: 4 or greater and Treatment may vary due to age or medical condition, Ondansetron, Propofol infusion, Dexamethasone, TIVA and Diphenhydramine  Airway Management Planned: Oral ETT  Additional Equipment: None  Intra-op Plan:   Post-operative Plan: Extubation in OR  Informed Consent: I have reviewed the patients History and Physical, chart, labs and discussed the procedure including the risks, benefits and alternatives for the proposed anesthesia with the patient or authorized representative who has indicated his/her understanding and  acceptance.     Dental advisory given  Plan Discussed with: CRNA and Anesthesiologist  Anesthesia Plan Comments:          Anesthesia Quick Evaluation

## 2022-08-02 NOTE — H&P (Signed)
Darlene Mcdowell is an 72 y.o. female.   Chief Complaint: Sleep apnea HPI: 72 year old female with obstructive sleep apnea who has been unable to tolerate CPAP.  Past Medical History:  Diagnosis Date   Anxiety    Arthritis    right shoulder   Cancer (HCC)    skin   GERD (gastroesophageal reflux disease)    Hyperlipidemia    Hypertension    Hypothyroidism    Motion sickness    PONV (postoperative nausea and vomiting)    Pre-diabetes    Sciatica of right side    Sleep apnea    Spinal stenosis of lumbar region    Thyroid disease    under active thyroid problems   Vitamin D deficiency     Past Surgical History:  Procedure Laterality Date   CATARACT EXTRACTION W/PHACO Left 06/30/2021   Procedure: CATARACT EXTRACTION PHACO AND INTRAOCULAR LENS PLACEMENT (IOC) LEFT 6.29 00:38.4;  Surgeon: Galen Manila, MD;  Location: [facility];  Service: Ophthalmology;  Laterality: Left;  mid morning or late arrival   CATARACT EXTRACTION W/PHACO Right 12/01/2021   Procedure: CATARACT EXTRACTION PHACO AND INTRAOCULAR LENS PLACEMENT (IOC) RIGHT  5.25  00:34.2;  Surgeon: Galen Manila, MD;  Location: [facility];  Service: Ophthalmology;  Laterality: Right;   DRUG INDUCED ENDOSCOPY Bilateral 06/15/2022   Procedure: DRUG INDUCED SLEEP  ENDOSCOPY;  Surgeon: Christia Reading, MD;  Location: [facility];  Service: ENT;  Laterality: Bilateral;   LUMBAR LAMINECTOMY  2021   [facility]   SPINE SURGERY  01/12/2003   cervical c-7   TUBAL LIGATION  01/12/1979    Family History  Problem Relation Age of Onset   Stroke Mother    Hypertension Mother    Hyperlipidemia Father    Heart disease Father    Hypertension Father    Cancer Maternal Grandmother    Heart disease Maternal Grandfather    Heart disease Paternal Grandmother    Breast cancer Neg Hx    Social History:  reports that she quit smoking about 19 years ago. Her smoking use included cigarettes. She  started smoking about 34 years ago. She has a 15 pack-year smoking history. She has never used smokeless tobacco. She reports that she does not currently use alcohol. She reports that she does not use drugs.  Allergies:  Allergies  Allergen Reactions   Hydrocodone Nausea And Vomiting   Oxycodone Nausea And Vomiting   Pantoprazole     GI issues   Tizanidine     GI issues   Wellbutrin [Bupropion] Swelling    Ear swelling    Medications Prior to Admission  Medication Sig Dispense Refill   acetaminophen (TYLENOL) 650 MG CR tablet Take 1,300 mg by mouth See admin instructions. Take 1300 mg in the morning, may take a second 1300 mg dose as needed for pain     ALPRAZolam (XANAX) 0.25 MG tablet Take 1 tablet by mouth once daily as needed for panic attacks (Patient taking differently: Take 0.25 mg by mouth 2 (two) times daily.) 10 tablet 0   aspirin 81 MG chewable tablet Chew 81 mg by mouth daily.     Coenzyme Q10 (COQ10) 200 MG CAPS Take 200 mg by mouth daily.     diclofenac Sodium (VOLTAREN) 1 % GEL Apply 1 Application topically 4 (four) times daily as needed (pain).     gabapentin (NEURONTIN) 100 MG capsule Take 100 mg by mouth 3 (three) times daily.  Homeopathic Products (LEG CRAMP RELIEF PO) Take 2 tablets by mouth 2 (two) times daily as needed (leg cramp).     levothyroxine (SYNTHROID, LEVOTHROID) 100 MCG tablet TAKE 1 TABLET BY MOUTH EVERY DAY 30 tablet 0   Menthol-Methyl Salicylate (SALONPAS PAIN RELIEF PATCH EX) Apply 1 patch topically daily as needed (pain).     Omega-3 Fatty Acids (FISH OIL PO) Take 2 capsules by mouth daily.     omeprazole (PRILOSEC) 40 MG capsule Take 40 mg by mouth daily.     rosuvastatin (CRESTOR) 10 MG tablet Take 10 mg by mouth daily.     valsartan (DIOVAN) 320 MG tablet Take 320 mg by mouth daily.     ACCU-CHEK GUIDE test strip USE TO TEST 1 (ONE) EACH FOR ONCE DAILY GLUCOSE MONITORING     Accu-Chek Softclix Lancets lancets SMARTSIG:1 Each Topical As  Directed     diphenhydramine-acetaminophen (TYLENOL PM) 25-500 MG TABS tablet Take 2 tablets by mouth at bedtime.     lidocaine 4 % Place 1 patch onto the skin daily as needed (pain).      No results found for this or any previous visit (from the past 48 hour(s)). No results found.  Review of Systems  All other systems reviewed and are negative.   Blood pressure 113/75, pulse 68, temperature (!) 97.4 F (36.3 C), temperature source Temporal, resp. rate 16, height 5\' 7"  (1.702 m), weight 76.2 kg, last menstrual period 01/11/1994, SpO2 100%. Physical Exam Constitutional:      Appearance: Normal appearance. She is normal weight.  HENT:     Head: Normocephalic and atraumatic.     Right Ear: External ear normal.     Left Ear: External ear normal.     Nose: Nose normal.     Mouth/Throat:     Mouth: Mucous membranes are moist.     Pharynx: Oropharynx is clear.  Eyes:     Extraocular Movements: Extraocular movements intact.     Conjunctiva/sclera: Conjunctivae normal.     Pupils: Pupils are equal, round, and reactive to light.  Cardiovascular:     Rate and Rhythm: Normal rate.  Pulmonary:     Effort: Pulmonary effort is normal.  Musculoskeletal:     Cervical back: Normal range of motion.  Skin:    General: Skin is warm and dry.  Neurological:     General: No focal deficit present.     Mental Status: She is alert and oriented to person, place, and time.  Psychiatric:        Mood and Affect: Mood normal.        Behavior: Behavior normal.        Thought Content: Thought content normal.        Judgment: Judgment normal.      Assessment/Plan Obstructive sleep apnea and BMI 26.31  To OR for hypoglossal nerve stimulator placement.  Christia Reading, MD 08/02/2022, 12:21 PM

## 2022-08-02 NOTE — Transfer of Care (Signed)
Immediate Anesthesia Transfer of Care Note  Patient: Darlene Mcdowell  Procedure(s) Performed: IMPLANTATION OF HYPOGLOSSAL NERVE STIMULATOR (Right: Neck)  Patient Location: PACU  Anesthesia Type:General  Level of Consciousness: sedated  Airway & Oxygen Therapy: Patient Spontanous Breathing and Patient connected to face mask oxygen  Post-op Assessment: Report given to RN and Post -op Vital signs reviewed and stable  Post vital signs: Reviewed and stable  Last Vitals:  Vitals Value Taken Time  BP 133/60 08/02/22 1507  Temp 36.1 C 08/02/22 1507  Pulse 78 08/02/22 1509  Resp 14 08/02/22 1509  SpO2 99 % 08/02/22 1509  Vitals shown include unfiled device data.  Last Pain:  Vitals:   08/02/22 1043  TempSrc: Temporal  PainSc: 0-No pain         Complications: No notable events documented.

## 2022-08-03 ENCOUNTER — Encounter (HOSPITAL_BASED_OUTPATIENT_CLINIC_OR_DEPARTMENT_OTHER): Payer: Self-pay | Admitting: Otolaryngology

## 2022-09-03 ENCOUNTER — Institutional Professional Consult (permissible substitution): Payer: Medicare Other | Admitting: Adult Health

## 2022-09-07 ENCOUNTER — Encounter: Payer: Self-pay | Admitting: Adult Health

## 2022-09-07 ENCOUNTER — Ambulatory Visit (INDEPENDENT_AMBULATORY_CARE_PROVIDER_SITE_OTHER): Payer: Medicare Other | Admitting: Adult Health

## 2022-09-07 VITALS — BP 130/80 | HR 69 | Ht 66.0 in | Wt 168.4 lb

## 2022-09-07 DIAGNOSIS — G4733 Obstructive sleep apnea (adult) (pediatric): Secondary | ICD-10-CM | POA: Diagnosis not present

## 2022-09-07 NOTE — Progress Notes (Unsigned)
@Patient  ID: Darlene Mcdowell, female    DOB: Jun 28, 1950, 72 y.o.   MRN: [id number]  Chief Complaint  Patient presents with   Consult    Referring provider: Arlyss Queen  HPI: 72 yo female seen for sleep consult 09/07/22 for sleep apnea. Underwent INSPIRE implantation 08/02/22.   TEST/EVENTS :  12/2021 home sleep study demonstrating an AHI of 35.8. Drug induced sleep endoscopy 06/15/22 -75% anterior posterior collapse.  Hypoglossal nerve stimulator placement 08/02/22    09/07/2022 Sleep Consult  Patient presents for a sleep consult today. Referred by Dr. Jenne Pane, ENT. Patient was diagnosed with severe sleep apnea in December 2023. Had snoring and daytime sleepiness. Had home sleep study on 12/2021 that showed severe OSA with AHI at 35.8 /hr. She was started on CPAP but was unable to tolerate. Referred to ENT for evaluation of INSPIRE. She was considered a good candidate . Underwent hypoglossal nerve stimulator placement August 02, 2022.  Patient says she did well after her procedure.  Has had no known difficulties.  No redness at the incision sites. No issues with swallowing or speech .  Inspire team present for visit today for INSPIRE activation .  Stimulation: Sensation level at 0.5 V and functional level at 0.9 V.  Range it was set at 0.9 to 1.9 V.  Start delay at 30 minutes, pause time 15 minutes and duration at 8 hours.  Pulse width at 90/rate at 33 Hz.  Waveform appeared normal.  Patient tolerated stimulation well.  Went over sleep remote with patient education.  Remote was set up along with patient phone app.  Patient demonstrated competency with her remote.  Social history patient is married lives at home with her husband.  She is retired.  Has adult children.  But smoking 2002.  Social alcohol.  No drug use.  Medical history significant for hypertension, hyperlipidemia and sleep apnea.  Past Surgical History:  Procedure Laterality Date   CATARACT EXTRACTION W/PHACO Left  06/30/2021   Procedure: CATARACT EXTRACTION PHACO AND INTRAOCULAR LENS PLACEMENT (IOC) LEFT 6.29 00:38.4;  Surgeon: Galen Manila, MD;  Location: [facility];  Service: Ophthalmology;  Laterality: Left;  mid morning or late arrival   CATARACT EXTRACTION W/PHACO Right 12/01/2021   Procedure: CATARACT EXTRACTION PHACO AND INTRAOCULAR LENS PLACEMENT (IOC) RIGHT  5.25  00:34.2;  Surgeon: Galen Manila, MD;  Location: [facility];  Service: Ophthalmology;  Laterality: Right;   DRUG INDUCED ENDOSCOPY Bilateral 06/15/2022   Procedure: DRUG INDUCED SLEEP  ENDOSCOPY;  Surgeon: Christia Reading, MD;  Location: [facility];  Service: ENT;  Laterality: Bilateral;   IMPLANTATION OF HYPOGLOSSAL NERVE STIMULATOR Right 08/02/2022   Procedure: IMPLANTATION OF HYPOGLOSSAL NERVE STIMULATOR;  Surgeon: Christia Reading, MD;  Location: [facility];  Service: ENT;  Laterality: Right;   LUMBAR LAMINECTOMY  2021   [facility]   SPINE SURGERY  01/12/2003   cervical c-7   TUBAL LIGATION  01/12/1979     Allergies  Allergen Reactions   Hydrocodone Nausea And Vomiting   Oxycodone Nausea And Vomiting   Pantoprazole     GI issues   Tizanidine     GI issues   Wellbutrin [Bupropion] Swelling    Ear swelling    Immunization History  Administered Date(s) Administered   Influenza Inj Mdck Quad Pf 10/21/2016   Influenza Split 01/20/2013   Influenza,inj,Quad PF,6+ Mos 10/11/2014, 11/05/2015   Influenza-Unspecified 10/11/2014, 11/05/2015   Pneumococcal Conjugate-13 12/03/2014   Pneumococcal Polysaccharide-23 11/22/2012  Tdap 01/20/2013   Zoster, Live 10/20/2015    Past Medical History:  Diagnosis Date   Anxiety    Arthritis    right shoulder   Cancer (HCC)    skin   GERD (gastroesophageal reflux disease)    Hyperlipidemia    Hypertension    Hypothyroidism    Motion sickness    PONV (postoperative nausea and vomiting)    Pre-diabetes    Sciatica of  right side    Sleep apnea    Spinal stenosis of lumbar region    Thyroid disease    under active thyroid problems   Vitamin D deficiency     Tobacco History: Social History   Tobacco Use  Smoking Status Former   Current packs/day: 0.00   Average packs/day: 1 pack/day for 15.0 years (15.0 ttl pk-yrs)   Types: Cigarettes   Start date: 01/12/1988   Quit date: 01/12/2003   Years since quitting: 19.6  Smokeless Tobacco Never   Counseling given: Not Answered   Outpatient Medications Prior to Visit  Medication Sig Dispense Refill   ACCU-CHEK GUIDE test strip USE TO TEST 1 (ONE) EACH FOR ONCE DAILY GLUCOSE MONITORING     Accu-Chek Softclix Lancets lancets SMARTSIG:1 Each Topical As Directed     acetaminophen (TYLENOL) 650 MG CR tablet Take 1,300 mg by mouth See admin instructions. Take 1300 mg in the morning, may take a second 1300 mg dose as needed for pain     ALPRAZolam (XANAX) 0.25 MG tablet Take 1 tablet by mouth once daily as needed for panic attacks (Patient taking differently: Take 0.25 mg by mouth 2 (two) times daily.) 10 tablet 0   aspirin 81 MG chewable tablet Chew 81 mg by mouth daily.     Coenzyme Q10 (COQ10) 200 MG CAPS Take 200 mg by mouth daily.     diclofenac Sodium (VOLTAREN) 1 % GEL Apply 1 Application topically 4 (four) times daily as needed (pain).     diphenhydramine-acetaminophen (TYLENOL PM) 25-500 MG TABS tablet Take 2 tablets by mouth at bedtime.     gabapentin (NEURONTIN) 100 MG capsule Take 100 mg by mouth 3 (three) times daily.     Homeopathic Products (LEG CRAMP RELIEF PO) Take 2 tablets by mouth 2 (two) times daily as needed (leg cramp).     levothyroxine (SYNTHROID, LEVOTHROID) 100 MCG tablet TAKE 1 TABLET BY MOUTH EVERY DAY 30 tablet 0   lidocaine 4 % Place 1 patch onto the skin daily as needed (pain).     rosuvastatin (CRESTOR) 10 MG tablet Take 10 mg by mouth daily.     valsartan (DIOVAN) 320 MG tablet Take 320 mg by mouth daily.     omeprazole  (PRILOSEC) 40 MG capsule Take 40 mg by mouth daily. (Patient not taking: Reported on 09/07/2022)     Menthol-Methyl Salicylate (SALONPAS PAIN RELIEF PATCH EX) Apply 1 patch topically daily as needed (pain). (Patient not taking: Reported on 09/07/2022)     Omega-3 Fatty Acids (FISH OIL PO) Take 2 capsules by mouth daily.     No facility-administered medications prior to visit.     Review of Systems:   Constitutional:   No  weight loss, night sweats,  Fevers, chills, fatigue, or  lassitude.  HEENT:   No headaches,  Difficulty swallowing,  Tooth/dental problems, or  Sore throat,                No sneezing, itching, ear ache, nasal congestion, post nasal drip,  CV:  No chest pain,  Orthopnea, PND, swelling in lower extremities, anasarca, dizziness, palpitations, syncope.   GI  No heartburn, indigestion, abdominal pain, nausea, vomiting, diarrhea, change in bowel habits, loss of appetite, bloody stools.   Resp: No shortness of breath with exertion or at rest.  No excess mucus, no productive cough,  No non-productive cough,  No coughing up of blood.  No change in color of mucus.  No wheezing.  No chest wall deformity  Skin: no rash or lesions.  GU: no dysuria, change in color of urine, no urgency or frequency.  No flank pain, no hematuria   MS:  No joint pain or swelling.  No decreased range of motion.  No back pain.    Physical Exam  BP 130/80 (BP Location: Left Arm, Patient Position: Sitting, Cuff Size: Normal)   Pulse 69   Ht 5\' 6"  (1.676 m)   Wt 168 lb 6.4 oz (76.4 kg)   LMP 01/11/1994   SpO2 98%   BMI 27.18 kg/m   GEN: A/Ox3; pleasant , NAD, well nourished    HEENT:  [place]/AT,  NOSE-clear, THROAT-clear, no lesions, no postnasal drip or exudate noted. Tongue movement appears normal   NECK:  Supple w/ fair ROM; no JVD; normal carotid impulses w/o bruits; no thyromegaly or nodules palpated; no lymphadenopathy.    RESP  Clear  P & A; w/o, wheezes/ rales/ or rhonchi. no accessory  muscle use, no dullness to percussion  CARD:  RRR, no m/r/g, no peripheral edema, pulses intact, no cyanosis or clubbing.  GI:   Soft & nt; nml bowel sounds; no organomegaly or masses detected.   Musco: Warm bil, no deformities or joint swelling noted.   Neuro: alert, no focal deficits noted.    Skin: Warm, no lesions or rashes. Incision sites  along right upper chest wall and neck/jaw appear healed, no redness noted.     Lab Results:    BNP No results found for: "BNP"  ProBNP No results found for: "PROBNP"  Imaging: No results found.  Administration History     None           No data to display          No results found for: "NITRICOXIDE"      Assessment & Plan:   No problem-specific Assessment & Plan notes found for this encounter.     Rubye Oaks, NP 09/07/2022

## 2022-09-07 NOTE — Patient Instructions (Signed)
Begin Inspire tonight, Use all night long .  Each week go up 1 level as you tolerate.  Call if you have any questions.  Follow up in 4 weeks with Dr. Wynona Neat or Azariah Bonura NP .

## 2022-09-08 DIAGNOSIS — G4733 Obstructive sleep apnea (adult) (pediatric): Secondary | ICD-10-CM | POA: Insufficient documentation

## 2022-09-08 NOTE — Assessment & Plan Note (Signed)
Severe obstructive sleep apnea-unable to tolerate CPAP.  Underwent hypoglossal nerve stimulator placement August 02, 2022.  Tolerated procedure without any known difficulties.  Incision sites appear to be healing well.  Tongue movement appears normal.   inspire activation today went well.  Sensation level at 0.5 V and functional level at 0.9 V.  Range was set at 0.9 to 1.9 V.  Inspire app and remote control set up were completed.  Patient  had competency with her remote.  We went over beginning at 0.9 V/11 1 and going up by 0.1 V each week as tolerated.  She will return back in the office at 4 weeks for follow-up.  Plan  Patient Instructions  Begin Inspire tonight, Use all night long .  Each week go up 1 level as you tolerate.  Call if you have any questions.  Follow up in 4 weeks with Dr. Wynona Neat or Renuka Farfan NP .

## 2022-09-17 ENCOUNTER — Other Ambulatory Visit: Payer: Self-pay | Admitting: Physician Assistant

## 2022-09-17 DIAGNOSIS — Z1231 Encounter for screening mammogram for malignant neoplasm of breast: Secondary | ICD-10-CM

## 2022-10-11 ENCOUNTER — Encounter: Payer: Self-pay | Admitting: Adult Health

## 2022-10-11 ENCOUNTER — Ambulatory Visit (INDEPENDENT_AMBULATORY_CARE_PROVIDER_SITE_OTHER): Payer: Medicare Other | Admitting: Adult Health

## 2022-10-11 ENCOUNTER — Other Ambulatory Visit: Payer: Self-pay | Admitting: Adult Health

## 2022-10-11 VITALS — BP 130/80 | HR 76 | Ht 67.0 in | Wt 166.2 lb

## 2022-10-11 DIAGNOSIS — G4733 Obstructive sleep apnea (adult) (pediatric): Secondary | ICD-10-CM

## 2022-10-11 NOTE — Assessment & Plan Note (Signed)
Severe obstructive sleep apnea-unable to tolerate CPAP.  Status post inspire device implantation on August 02, 2022.  Inspire activation on September 07, 2022.  Patient has been able to titrate up 1 level since last visit currently on level 5 seems to be tolerating well and has perceived benefit.  Will proceed with inspire titration study in 4 weeks.  Will continue on current setting range 0.9 to 1.9 V.  Start delay will be 30 minutes, pause time 15-minute, duration was extended to 10 hours.  Plan  Patient Instructions  Set up for Inspire titration study in 4 weeks  Continue on Inspire each night, use all night long .  Each week go up 1 level as you tolerate.  Call if you have any questions.  Follow up in 6-8  weeks with Dr. Wynona Neat or Byron Peacock NP .

## 2022-10-11 NOTE — Progress Notes (Signed)
@Patient  ID: Darlene Mcdowell, female    DOB: September 16, 1950, 72 y.o.   MRN: [id number]  Chief Complaint  Patient presents with   Follow-up    Referring provider: Arlyss Queen  HPI: 72 year old female seen for sleep consult September 07, 2022 for sleep apnea.  She was CPAP intolerant.  Underwent inspire implantation August 02, 2022  TEST/EVENTS :  12/2021 home sleep study demonstrating an AHI of 35.8. Drug induced sleep endoscopy 06/15/22 -75% anterior posterior collapse.  Hypoglossal nerve stimulator placement 08/02/22   INSPIRE activation 09/07/22 :  Stimulation: Sensation level at 0.5 V and functional level at 0.9 V.  Range it was set at 0.9 to 1.9 V.  Start delay at 30 minutes, pause time 15 minutes and duration at 8 hours.  Pulse width at 90/rate at 33 Hz.   10/11/2022 Follow up: OSA, status post inspire device. Patient returns for a 1 month follow-up.  Patient was diagnosed with severe sleep apnea and December 2023.  She was started on CPAP therapy but was unable to tolerate.  She was referred to ENT for evaluation of inspire device.  She underwent drug-induced sleep endoscopy June 2024 that showed she was a good candidate.  And she underwent hypoglossal nerve stimulator placement August 02, 2022.  Inspire activation was done on September 07, 2022.  Did well for Activation.  Range it was set at 0.9 to 1.9 V.  Start delay at 30 minutes, pause time 15 minutes and duration at 8 hours.  Pulse width at 90/rate at 33 Hz.  Since last visit patient says she has been doing well.  Has perceived benefit with decreased daytime sleeping and feel better.  Patient compliance report shows excellent 100% usage.  Daily average usage at 8.5 hours.  Nightly average pauses at 2.  She is currently on 1.3 V/level 5. Has been able to increase weekly by 1 level . Says has mild discomfort on first night of increase briefly then next night is fine. Does complain that she needs the length of therapy to be extended as she  sometimes sleeps longer and sometimes has trouble going to sleep so it turns off before she wakes up .  No difficulty swallowing . Waveform today is normal    Allergies  Allergen Reactions   Hydrocodone Nausea And Vomiting   Oxycodone Nausea And Vomiting   Pantoprazole     GI issues   Tizanidine     GI issues   Wellbutrin [Bupropion] Swelling    Ear swelling    Immunization History  Administered Date(s) Administered   Influenza Inj Mdck Quad Pf 10/21/2016   Influenza Split 01/20/2013   Influenza,inj,Quad PF,6+ Mos 10/11/2014, 11/05/2015   Influenza-Unspecified 10/11/2014, 11/05/2015   Pneumococcal Conjugate-13 12/03/2014   Pneumococcal Polysaccharide-23 11/22/2012   Tdap 01/20/2013   Zoster, Live 10/20/2015    Past Medical History:  Diagnosis Date   Anxiety    Arthritis    right shoulder   Cancer (HCC)    skin   GERD (gastroesophageal reflux disease)    Hyperlipidemia    Hypertension    Hypothyroidism    Motion sickness    PONV (postoperative nausea and vomiting)    Pre-diabetes    Sciatica of right side    Sleep apnea    Spinal stenosis of lumbar region    Thyroid disease    under active thyroid problems   Vitamin D deficiency     Tobacco History: Social History   Tobacco Use  Smoking  Status Former   Current packs/day: 0.00   Average packs/day: 1 pack/day for 15.0 years (15.0 ttl pk-yrs)   Types: Cigarettes   Start date: 01/12/1988   Quit date: 01/12/2003   Years since quitting: 19.7  Smokeless Tobacco Never   Counseling given: Not Answered   Outpatient Medications Prior to Visit  Medication Sig Dispense Refill   ACCU-CHEK GUIDE test strip USE TO TEST 1 (ONE) EACH FOR ONCE DAILY GLUCOSE MONITORING     Accu-Chek Softclix Lancets lancets SMARTSIG:1 Each Topical As Directed     acetaminophen (TYLENOL) 650 MG CR tablet Take 1,300 mg by mouth See admin instructions. Take 1300 mg in the morning, may take a second 1300 mg dose as needed for pain      ALPRAZolam (XANAX) 0.25 MG tablet Take 1 tablet by mouth once daily as needed for panic attacks (Patient taking differently: Take 0.25 mg by mouth 2 (two) times daily.) 10 tablet 0   aspirin 81 MG chewable tablet Chew 81 mg by mouth daily.     Coenzyme Q10 (COQ10) 200 MG CAPS Take 200 mg by mouth daily.     diclofenac Sodium (VOLTAREN) 1 % GEL Apply 1 Application topically 4 (four) times daily as needed (pain).     diphenhydramine-acetaminophen (TYLENOL PM) 25-500 MG TABS tablet Take 2 tablets by mouth at bedtime.     gabapentin (NEURONTIN) 100 MG capsule Take 100 mg by mouth 3 (three) times daily.     Homeopathic Products (LEG CRAMP RELIEF PO) Take 2 tablets by mouth 2 (two) times daily as needed (leg cramp).     levothyroxine (SYNTHROID, LEVOTHROID) 100 MCG tablet TAKE 1 TABLET BY MOUTH EVERY DAY 30 tablet 0   omeprazole (PRILOSEC) 40 MG capsule Take 40 mg by mouth daily.     rosuvastatin (CRESTOR) 10 MG tablet Take 10 mg by mouth daily.     valsartan (DIOVAN) 320 MG tablet Take 320 mg by mouth daily.     lidocaine 4 % Place 1 patch onto the skin daily as needed (pain).     No facility-administered medications prior to visit.     Review of Systems:   Constitutional:   No  weight loss, night sweats,  Fevers, chills, fatigue, or  lassitude.  HEENT:   No headaches,  Difficulty swallowing,  Tooth/dental problems, or  Sore throat,                No sneezing, itching, ear ache, nasal congestion, post nasal drip,   CV:  No chest pain,  Orthopnea, PND, swelling in lower extremities, anasarca, dizziness, palpitations, syncope.   GI  No heartburn, indigestion, abdominal pain, nausea, vomiting, diarrhea, change in bowel habits, loss of appetite, bloody stools.   Resp: No shortness of breath with exertion or at rest.  No excess mucus, no productive cough,  No non-productive cough,  No coughing up of blood.  No change in color of mucus.  No wheezing.  No chest wall deformity  Skin: no rash or  lesions.  GU: no dysuria, change in color of urine, no urgency or frequency.  No flank pain, no hematuria   MS:  No joint pain or swelling.  No decreased range of motion.  No back pain.    Physical Exam  BP 130/80 (BP Location: Left Arm, Cuff Size: Normal)   Pulse 76   Ht 5\' 7"  (1.702 m)   Wt 166 lb 3.2 oz (75.4 kg)   LMP 01/11/1994   SpO2 96%  BMI 26.03 kg/m   GEN: A/Ox3; pleasant , NAD, well nourished    HEENT:  [place]/AT,  , NOSE-clear, THROAT-clear, no lesions, no postnasal drip or exudate noted. Tongue movement is normal   NECK:  Supple w/ fair ROM; no JVD; normal carotid impulses w/o bruits; no thyromegaly or nodules palpated; no lymphadenopathy.    RESP  Clear  P & A; w/o, wheezes/ rales/ or rhonchi. no accessory muscle use, no dullness to percussion  CARD:  RRR, no m/r/g, no peripheral edema, pulses intact, no cyanosis or clubbing.  GI:   Soft & nt; nml bowel sounds; no organomegaly or masses detected.   Musco: Warm bil, no deformities or joint swelling noted.   Neuro: alert, no focal deficits noted.    Skin: Warm, no lesions or rashes    Lab Results:  CBC   BNP No results found for: "BNP"  ProBNP No results found for: "PROBNP"  Imaging: No results found.  Administration History     None           No data to display          No results found for: "NITRICOXIDE"      Assessment & Plan:   OSA (obstructive sleep apnea) Severe obstructive sleep apnea-unable to tolerate CPAP.  Status post inspire device implantation on August 02, 2022.  Inspire activation on September 07, 2022.  Patient has been able to titrate up 1 level since last visit currently on level 5 seems to be tolerating well and has perceived benefit.  Will proceed with inspire titration study in 4 weeks.  Will continue on current setting range 0.9 to 1.9 V.  Start delay will be 30 minutes, pause time 15-minute, duration was extended to 10 hours.  Plan  Patient Instructions  Set up  for Inspire titration study in 4 weeks  Continue on Inspire each night, use all night long .  Each week go up 1 level as you tolerate.  Call if you have any questions.  Follow up in 6-8  weeks with Dr. Wynona Neat or Chyanna Flock NP .       I spent   30 minutes dedicated to the care of this patient on the date of this encounter to include pre-visit review of records, face-to-face time with the patient discussing conditions above, post visit ordering of testing, clinical documentation with the electronic health record, making appropriate referrals as documented, and communicating necessary findings to members of the patients care team.   Rubye Oaks, NP 10/11/2022

## 2022-10-11 NOTE — Patient Instructions (Signed)
Set up for Inspire titration study in 4 weeks  Continue on Inspire each night, use all night long .  Each week go up 1 level as you tolerate.  Call if you have any questions.  Follow up in 6-8  weeks with Dr. Wynona Neat or Breean Nannini NP .

## 2022-10-12 ENCOUNTER — Encounter: Payer: Self-pay | Admitting: Adult Health

## 2022-10-22 ENCOUNTER — Encounter (HOSPITAL_BASED_OUTPATIENT_CLINIC_OR_DEPARTMENT_OTHER): Payer: Self-pay | Admitting: Pulmonary Disease

## 2022-10-30 NOTE — Telephone Encounter (Signed)
Plan  Patient Instructions  Set up for Inspire titration study in 4 weeks  Continue on Inspire each night, use all night long .  Each week go up 1 level as you tolerate.  Call if you have any questions.  Follow up in 6-8  weeks with Dr. Wynona Neat or Parrett NP   Mychart message sent by pt:  Darlene Mcdowell [facility] (supporting Tammy S Parrett, NP)21 hours ago (11:37 AM)    Hello In receipt of paperwork from [facility] for upcoming sleep study re my Inspire Implant.    The info they sent suggest I verify my insurance pre authorization if required for this procedure.   I am at a loss, do I need to contact my Medicare and supplemental insurance for this info, or can you tell me if this procedure has been pre authorized as I think it has been. Thank you for any info. Darlene Mcdowell     Routing to [employer] for review.

## 2022-11-09 ENCOUNTER — Ambulatory Visit (HOSPITAL_BASED_OUTPATIENT_CLINIC_OR_DEPARTMENT_OTHER): Payer: Medicare Other | Attending: Adult Health | Admitting: Pulmonary Disease

## 2022-11-09 DIAGNOSIS — G4733 Obstructive sleep apnea (adult) (pediatric): Secondary | ICD-10-CM | POA: Diagnosis present

## 2022-11-11 ENCOUNTER — Telehealth: Payer: Self-pay

## 2022-11-11 NOTE — Telephone Encounter (Signed)
Can we get a Sleep sync download and are study results read

## 2022-11-11 NOTE — Telephone Encounter (Signed)
Tammy - I have placed a sleep sync download underneath your keyboard.  I will ask one of the doc's to read the sleep studies.

## 2022-11-11 NOTE — Telephone Encounter (Signed)
Returning missed call

## 2022-11-12 DIAGNOSIS — G4733 Obstructive sleep apnea (adult) (pediatric): Secondary | ICD-10-CM | POA: Diagnosis not present

## 2022-11-12 NOTE — Procedures (Signed)
Patient Name: Darlene Mcdowell, Darlene Mcdowell Date: 11/09/2022 Gender: Female D.O.B: 03-24-1950 Age (years): 77 Referring Provider: Babette Relic Parrett Height (inches): 67 Interpreting Physician: Cyril Mourning MD, ABSM Weight (lbs): 168 RPSGT: Ulyess Mort BMI: 26 MRN: [id number] Neck Size: 15.00 <br> <br> CLINICAL INFORMATION The patient is referred for inspire titration to treat sleep apnea.    12/2021 home sleep study demonstrating an AHI of 35.8.  SLEEP STUDY TECHNIQUE As per the AASM Manual for the Scoring of Sleep and Associated Events v2.3 (April 2016) with a hypopnea requiring 4% desaturations.  The channels recorded and monitored were frontal, central and occipital EEG, electrooculogram (EOG), submentalis EMG (chin), nasal and oral airflow, thoracic and abdominal wall motion, anterior tibialis EMG, snore microphone, electrocardiogram, and pulse oximetry. Continuous positive airway pressure (CPAP) was initiated at the beginning of the study and titrated to treat sleep-disordered breathing.  MEDICATIONS Medications self-administered by patient taken the night of the study : ALPRAZOLAM, BENADRYL, GABAPENTIN, MELATONIN  RESPIRATORY PARAMETERS Optimal amplitude (V): 2.0 AHI at Optimal amp (/hr): 33.4 Overall Minimal O2 (%): 85.0 Supine % at Optimal amp (%): 0 Minimal O2 at Optimal amp (%): 87.0   SLEEP ARCHITECTURE The study was initiated at 10:56:27 PM and ended at 4:57:00 AM.  Sleep onset time was 52.9 minutes and the sleep efficiency was 67.3%. The total sleep time was 242.5 minutes.  The patient spent 24.9% of the night in stage N1 sleep, 66.2% in stage N2 sleep, 0.0% in stage N3 and 8.9% in REM.Stage REM latency was 255.0 minutes  Wake after sleep onset was 65.1. Alpha intrusion was absent. Supine sleep was 49.07%.  CARDIAC DATA The 2 lead EKG demonstrated sinus rhythm. The mean heart rate was 61.6 beats per minute. Other EKG findings include: None.  LEG MOVEMENT DATA The total  Periodic Limb Movements of Sleep (PLMS) were 0. The PLMS index was 0.0. A PLMS index of <15 is considered normal in adults.  IMPRESSIONS - The optimal therapeutic amplitude was not found. Incoming amp was 1.5 V & inspire was titrated to 2.0 V. RERAs & hypopneas persisted at all levels - Moderate oxygen desaturations were observed during this titration (min O2 = 85.0%). - The patient snored with moderate snoring volume during this titration study. - No cardiac abnormalities were observed during this study. - Clinically significant periodic limb movements were not noted during this study. Arousals associated with PLMs were rare.  DIAGNOSIS - Obstructive Sleep Apnea (G47.33)  RECOMMENDATIONS - May need further titration of device. Alternatively, consider different electrode configuration. - Avoid alcohol, sedatives and other CNS depressants that may worsen sleep apnea and disrupt normal sleep architecture. - Sleep hygiene should be reviewed to assess factors that may improve sleep quality. - Weight management and regular exercise should be initiated or continued. - Return to Sleep Center for re-evaluation after 4 weeks of therapy  [Electronically signed] 11/12/2022 10:59 AM  Cyril Mourning MD, ABSM Diplomate, American Board of Sleep Medicine NPI: [id number]

## 2022-11-12 NOTE — Telephone Encounter (Signed)
Holly her titration did not show good control, need ov with me or Alva/Olalere and INspire team to change configuration   Try to get her in ~1-2 weeks if possible can double book   Patient aware   She will adjust setting for comfort.

## 2022-11-17 NOTE — Telephone Encounter (Signed)
Spoke with patient , she will decrease level for comfort.  Has ov in 4 weeks with [facility] team and will adjust setting at visit .

## 2022-12-13 ENCOUNTER — Telehealth: Payer: Self-pay | Admitting: Adult Health

## 2022-12-13 ENCOUNTER — Ambulatory Visit: Payer: Medicare Other | Admitting: Adult Health

## 2022-12-13 ENCOUNTER — Encounter: Payer: Self-pay | Admitting: Adult Health

## 2022-12-13 VITALS — BP 118/74 | HR 76 | Temp 97.8°F | Ht 67.0 in | Wt 168.0 lb

## 2022-12-13 DIAGNOSIS — G4733 Obstructive sleep apnea (adult) (pediatric): Secondary | ICD-10-CM | POA: Diagnosis not present

## 2022-12-13 NOTE — Telephone Encounter (Signed)
Darlene Mcdowell can you set up inspire follow up end of Jan 2025

## 2022-12-13 NOTE — Patient Instructions (Signed)
Continue on Inspire each night, use all night long .  Titrate up as comfortable.  Call if you have any questions.  Follow up in 6-8  weeks with Dr. Wynona Neat or Sonny Poth NP . [facility] will call you )

## 2022-12-13 NOTE — Progress Notes (Signed)
@Patient  ID: Darlene Mcdowell, female    DOB: 09-03-1950, 72 y.o.   MRN: [id number]  Chief Complaint  Patient presents with   Follow-up    Referring provider: Lonie Peak, PA-C  HPI:   TEST/EVENTS :   Allergies  Allergen Reactions   Hydrocodone Nausea And Vomiting   Oxycodone Nausea And Vomiting   Pantoprazole     GI issues   Tizanidine     GI issues   Wellbutrin [Bupropion] Swelling    Ear swelling    Immunization History  Administered Date(s) Administered   Influenza Inj Mdck Quad Pf 10/21/2016   Influenza Split 01/20/2013   Influenza,inj,Quad PF,6+ Mos 10/11/2014, 11/05/2015   Influenza-Unspecified 10/11/2014, 11/05/2015   Pneumococcal Conjugate-13 12/03/2014   Pneumococcal Polysaccharide-23 11/22/2012   Tdap 01/20/2013   Zoster, Live 10/20/2015    Past Medical History:  Diagnosis Date   Anxiety    Arthritis    right shoulder   Cancer (HCC)    skin   GERD (gastroesophageal reflux disease)    Hyperlipidemia    Hypertension    Hypothyroidism    Motion sickness    PONV (postoperative nausea and vomiting)    Pre-diabetes    Sciatica of right side    Sleep apnea    Spinal stenosis of lumbar region    Thyroid disease    under active thyroid problems   Vitamin D deficiency     Tobacco History: Social History   Tobacco Use  Smoking Status Former   Current packs/day: 0.00   Average packs/day: 1 pack/day for 15.0 years (15.0 ttl pk-yrs)   Types: Cigarettes   Start date: 01/12/1988   Quit date: 01/12/2003   Years since quitting: 19.9  Smokeless Tobacco Never   Counseling given: Not Answered   Outpatient Medications Prior to Visit  Medication Sig Dispense Refill   ACCU-CHEK GUIDE test strip USE TO TEST 1 (ONE) EACH FOR ONCE DAILY GLUCOSE MONITORING     Accu-Chek Softclix Lancets lancets SMARTSIG:1 Each Topical As Directed     acetaminophen (TYLENOL) 650 MG CR tablet Take 1,300 mg by mouth See admin instructions. Take 1300 mg in the morning, may  take a second 1300 mg dose as needed for pain     ALPRAZolam (XANAX) 0.25 MG tablet Take 1 tablet by mouth once daily as needed for panic attacks (Patient taking differently: Take 0.25 mg by mouth 2 (two) times daily.) 10 tablet 0   aspirin 81 MG chewable tablet Chew 81 mg by mouth daily.     Coenzyme Q10 (COQ10) 200 MG CAPS Take 200 mg by mouth daily.     diclofenac Sodium (VOLTAREN) 1 % GEL Apply 1 Application topically 4 (four) times daily as needed (pain).     diphenhydramine-acetaminophen (TYLENOL PM) 25-500 MG TABS tablet Take 2 tablets by mouth at bedtime.     gabapentin (NEURONTIN) 100 MG capsule Take 100 mg by mouth 3 (three) times daily.     Homeopathic Products (LEG CRAMP RELIEF PO) Take 2 tablets by mouth 2 (two) times daily as needed (leg cramp).     levothyroxine (SYNTHROID, LEVOTHROID) 100 MCG tablet TAKE 1 TABLET BY MOUTH EVERY DAY 30 tablet 0   omeprazole (PRILOSEC) 40 MG capsule Take 40 mg by mouth daily.     rosuvastatin (CRESTOR) 10 MG tablet Take 10 mg by mouth daily.     valsartan (DIOVAN) 320 MG tablet Take 320 mg by mouth daily.     lidocaine 4 % Place 1  patch onto the skin daily as needed (pain).     No facility-administered medications prior to visit.     Review of Systems:   Constitutional:   No  weight loss, night sweats,  Fevers, chills, fatigue, or  lassitude.  HEENT:   No headaches,  Difficulty swallowing,  Tooth/dental problems, or  Sore throat,                No sneezing, itching, ear ache, nasal congestion, post nasal drip,   CV:  No chest pain,  Orthopnea, PND, swelling in lower extremities, anasarca, dizziness, palpitations, syncope.   GI  No heartburn, indigestion, abdominal pain, nausea, vomiting, diarrhea, change in bowel habits, loss of appetite, bloody stools.   Resp: No shortness of breath with exertion or at rest.  No excess mucus, no productive cough,  No non-productive cough,  No coughing up of blood.  No change in color of mucus.  No  wheezing.  No chest wall deformity  Skin: no rash or lesions.  GU: no dysuria, change in color of urine, no urgency or frequency.  No flank pain, no hematuria   MS:  No joint pain or swelling.  No decreased range of motion.  No back pain.    Physical Exam  LMP 01/11/1994   GEN: A/Ox3; pleasant , NAD, well nourished    HEENT:  [place]/AT,  EACs-clear, TMs-wnl, NOSE-clear, THROAT-clear, no lesions, no postnasal drip or exudate noted.   NECK:  Supple w/ fair ROM; no JVD; normal carotid impulses w/o bruits; no thyromegaly or nodules palpated; no lymphadenopathy.    RESP  Clear  P & A; w/o, wheezes/ rales/ or rhonchi. no accessory muscle use, no dullness to percussion  CARD:  RRR, no m/r/g, no peripheral edema, pulses intact, no cyanosis or clubbing.  GI:   Soft & nt; nml bowel sounds; no organomegaly or masses detected.   Musco: Warm bil, no deformities or joint swelling noted.   Neuro: alert, no focal deficits noted.    Skin: Warm, no lesions or rashes    Lab Results:  CBC    Component Value Date/Time   WBC 9.7 07/28/2022 1430   RBC 4.49 07/28/2022 1430   HGB 14.0 07/28/2022 1430   HCT 41.3 07/28/2022 1430   PLT 188 07/28/2022 1430   MCV 92.0 07/28/2022 1430   MCH 31.2 07/28/2022 1430   MCHC 33.9 07/28/2022 1430   RDW 12.6 07/28/2022 1430    BMET    Component Value Date/Time   NA 135 07/28/2022 1430   NA 142 01/09/2013 0000   K 4.3 07/28/2022 1430   CL 104 07/28/2022 1430   CO2 23 07/28/2022 1430   GLUCOSE 110 (H) 07/28/2022 1430   BUN 23 07/28/2022 1430   BUN 14 01/09/2013 0000   CREATININE 0.82 07/28/2022 1430   CALCIUM 10.2 07/28/2022 1430   GFRNONAA >60 07/28/2022 1430    BNP No results found for: "BNP"  ProBNP No results found for: "PROBNP"  Imaging: No results found.  Administration History     None           No data to display          No results found for: "NITRICOXIDE"      Assessment & Plan:   No problem-specific  Assessment & Plan notes found for this encounter.     Rubye Oaks, NP 12/13/2022

## 2022-12-13 NOTE — Progress Notes (Signed)
@Patient  ID: Darlene Mcdowell, female    DOB: October 28, 1950, 72 y.o.   MRN: [id number]  Chief Complaint  Patient presents with   Follow-up    Referring provider: Arlyss Queen  HPI: 72 year old female seen for sleep consult September 07, 2022 for sleep apnea.  She was CPAP intolerant.  Underwent inspire implantation August 02, 2022   TEST/EVENTS :  12/2021 home sleep study demonstrating an AHI of 35.8. Drug induced sleep endoscopy 06/15/22 -75% anterior posterior collapse.  Hypoglossal nerve stimulator placement 08/02/22    INSPIRE activation 09/07/22 :  Stimulation: Sensation level at 0.5 V and functional level at 0.9 V.  Range it was set at 0.9 to 1.9 V.  Start delay at 30 minutes, pause time 15 minutes and duration at 8 hours.  Pulse width at 90/rate at 33 Hz.   12/13/2022 Follow up : OSA /Inspire device  Patient presents for a 82-month follow-up.  Patient was diagnosed with sleep apnea in December 2023.  She was started on CPAP but was unable to tolerate.  She was referred to ENT and was evaluated for the inspire device.  She had inspire implantation August 02, 2022.  She had inspire activation on September 07, 2022.  At last visit she was at 1.3 V/level 5 and was doing well.  She was set up for inspire titration study on incoming voltage at 1.5 V that was done on November 09, 2022.  There was no optimal therapeutic amplitude found.  Since titration study patient says she has been using her inspire each night.  Has had a cold the last week and had some difficulty managing the device and mucus but is doing better the last couple nights.  Continues to have some trouble with discomfort in her tongue at nighttime especially if she does not fall asleep before the inspire turns on.  Inspire team is present for today's visit.  She has no difficulty swallowing or with her speech.  Inspire download shows excellent compliance with daily average usage at 8.5 hours.  100% compliant.  Adjustments were made to  current setting.  She was changed to electrode B. At -stimulation level to 0.7V , range 0.7 to 1.1V, and extend the start delay to 45 minutes. Waveform was normal .  Despite using her device every night she does feel tired and sometimes has to take a 20-minute power nap.  Allergies  Allergen Reactions   Hydrocodone Nausea And Vomiting   Oxycodone Nausea And Vomiting   Pantoprazole     GI issues   Tizanidine     GI issues   Wellbutrin [Bupropion] Swelling    Ear swelling    Immunization History  Administered Date(s) Administered   Influenza Inj Mdck Quad Pf 10/21/2016   Influenza Split 01/20/2013   Influenza,inj,Quad PF,6+ Mos 10/11/2014, 11/05/2015   Influenza-Unspecified 10/11/2014, 11/05/2015   Pneumococcal Conjugate-13 12/03/2014   Pneumococcal Polysaccharide-23 11/22/2012   Tdap 01/20/2013   Zoster, Live 10/20/2015    Past Medical History:  Diagnosis Date   Anxiety    Arthritis    right shoulder   Cancer (HCC)    skin   GERD (gastroesophageal reflux disease)    Hyperlipidemia    Hypertension    Hypothyroidism    Motion sickness    PONV (postoperative nausea and vomiting)    Pre-diabetes    Sciatica of right side    Sleep apnea    Spinal stenosis of lumbar region    Thyroid disease  under active thyroid problems   Vitamin D deficiency     Tobacco History: Social History   Tobacco Use  Smoking Status Former   Current packs/day: 0.00   Average packs/day: 1 pack/day for 15.0 years (15.0 ttl pk-yrs)   Types: Cigarettes   Start date: 01/12/1988   Quit date: 01/12/2003   Years since quitting: 19.9  Smokeless Tobacco Never   Counseling given: Not Answered   Outpatient Medications Prior to Visit  Medication Sig Dispense Refill   ACCU-CHEK GUIDE test strip USE TO TEST 1 (ONE) EACH FOR ONCE DAILY GLUCOSE MONITORING     Accu-Chek Softclix Lancets lancets SMARTSIG:1 Each Topical As Directed     acetaminophen (TYLENOL) 650 MG CR tablet Take 1,300 mg by mouth  See admin instructions. Take 1300 mg in the morning, may take a second 1300 mg dose as needed for pain     ALPRAZolam (XANAX) 0.25 MG tablet Take 1 tablet by mouth once daily as needed for panic attacks (Patient taking differently: Take 0.25 mg by mouth 2 (two) times daily.) 10 tablet 0   aspirin 81 MG chewable tablet Chew 81 mg by mouth daily.     Coenzyme Q10 (COQ10) 200 MG CAPS Take 200 mg by mouth daily.     diclofenac Sodium (VOLTAREN) 1 % GEL Apply 1 Application topically 4 (four) times daily as needed (pain).     diphenhydramine-acetaminophen (TYLENOL PM) 25-500 MG TABS tablet Take 2 tablets by mouth at bedtime.     gabapentin (NEURONTIN) 100 MG capsule Take 100 mg by mouth 3 (three) times daily.     Homeopathic Products (LEG CRAMP RELIEF PO) Take 2 tablets by mouth 2 (two) times daily as needed (leg cramp).     levothyroxine (SYNTHROID, LEVOTHROID) 100 MCG tablet TAKE 1 TABLET BY MOUTH EVERY DAY 30 tablet 0   omeprazole (PRILOSEC) 40 MG capsule Take 40 mg by mouth daily.     rosuvastatin (CRESTOR) 10 MG tablet Take 10 mg by mouth daily.     valsartan (DIOVAN) 320 MG tablet Take 320 mg by mouth daily.     lidocaine 4 % Place 1 patch onto the skin daily as needed (pain).     No facility-administered medications prior to visit.     Review of Systems:   Constitutional:   No  weight loss, night sweats,  Fevers, chills, fatigue, or  lassitude.  HEENT:   No headaches,  Difficulty swallowing,  Tooth/dental problems, or  Sore throat,                No sneezing, itching, ear ache, nasal congestion, post nasal drip,   CV:  No chest pain,  Orthopnea, PND, swelling in lower extremities, anasarca, dizziness, palpitations, syncope.   GI  No heartburn, indigestion, abdominal pain, nausea, vomiting, diarrhea, change in bowel habits, loss of appetite, bloody stools.   Resp: No shortness of breath with exertion or at rest.  No excess mucus, no productive cough,  No non-productive cough,  No  coughing up of blood.  No change in color of mucus.  No wheezing.  No chest wall deformity  Skin: no rash or lesions.  GU: no dysuria, change in color of urine, no urgency or frequency.  No flank pain, no hematuria   MS:  No joint pain or swelling.  No decreased range of motion.  No back pain.    Physical Exam  BP 118/74 (BP Location: Left Arm, Patient Position: Sitting, Cuff Size: Small)   Pulse  76   Temp 97.8 F (36.6 C) (Oral)   Ht 5\' 7"  (1.702 m)   Wt 168 lb (76.2 kg)   LMP 01/11/1994   SpO2 96%   BMI 26.31 kg/m   GEN: A/Ox3; pleasant , NAD, well nourished    HEENT:  TMs-wnl, NOSE-clear, THROAT-clear, no lesions, no postnasal drip or exudate noted.   NECK:  Supple w/ fair ROM; no JVD; normal carotid impulses w/o bruits; no thyromegaly or nodules palpated; no lymphadenopathy.    RESP  Clear  P & A; w/o, wheezes/ rales/ or rhonchi. no accessory muscle use, no dullness to percussion  CARD:  RRR, no m/r/g, no peripheral edema, pulses intact, no cyanosis or clubbing.  GI:   Soft & nt; nml bowel sounds; no organomegaly or masses detected.   Musco: Warm bil, no deformities or joint swelling noted.   Neuro: alert, no focal deficits noted.    Skin: Warm, no lesions or rashes    Lab Results:  CBC    BNP No results found for: "BNP"  ProBNP No results found for: "PROBNP"  Imaging: No results found.  Administration History     None           No data to display          No results found for: "NITRICOXIDE"      Assessment & Plan:   Assessment and Plan    Severe Obstructive Sleep Apnea (OSA)   Severe obstructive sleep apnea CPAP intolerance status post inspire device.  Recent inspire titration did not show optimal therapeutic amplitude.  Today in the office inspire team was present.  She was changed to electrode B with decreased amplitude at 0.7 V.  Range will be 0.7 V to 1.1 V.  We extended her start time-45 minutes.  She is to slowly titrate up  as tolerated.  She will follow back up in the office in 6 to 8 weeks.  Plan  Patient Instructions  Continue on Inspire each night, use all night long .  Titrate up as comfortable.  Call if you have any questions.  Follow up in 6-8  weeks with Dr. Wynona Neat or Masin Shatto NP . [facility] will call you )       I spent 31   minutes dedicated to the care of this patient on the date of this encounter to include pre-visit review of records, face-to-face time with the patient discussing conditions above, post visit ordering of testing, clinical documentation with the electronic health record, making appropriate referrals as documented, and communicating necessary findings to members of the patients care team.    Rubye Oaks, NP 12/13/2022

## 2023-01-21 ENCOUNTER — Encounter: Payer: Self-pay | Admitting: Adult Health

## 2023-01-21 ENCOUNTER — Ambulatory Visit: Payer: Medicare Other | Admitting: Adult Health

## 2023-01-21 VITALS — BP 108/80 | HR 69 | Ht 67.0 in | Wt 167.4 lb

## 2023-01-21 DIAGNOSIS — G4733 Obstructive sleep apnea (adult) (pediatric): Secondary | ICD-10-CM

## 2023-01-21 NOTE — Progress Notes (Signed)
 @Patient  ID: Darlene Mcdowell, female    DOB: 23-Oct-1950, 73 y.o.   MRN: [id number]  Chief Complaint  Patient presents with   Follow-up    Referring provider: Montey Rankin Mcdowell  HPI: 73 year old female seen for sleep consult September 07, 2022 for sleep apnea.  Patient is CPAP intolerant.  Underwent inspire implantation August 02, 2022  TEST/EVENTS :  12/2021 home sleep study demonstrating an AHI of 35.8. Drug induced sleep endoscopy 06/15/22 -75% anterior posterior collapse.  Hypoglossal nerve stimulator placement 08/02/22    INSPIRE activation 09/07/22 :  Stimulation: Sensation level at 0.5 V and functional level at 0.9 V.  Range it was set at 0.9 to 1.9 V.  Start delay at 30 minutes, pause time 15 minutes and duration at 8 hours.  Pulse width at 90/rate at 33 Hz.   01/21/2023 Follow up : OSA [name] Device  Patient presents for a 75-month follow-up.  Patient has underlying severe sleep apnea.  She has been CPAP intolerant.  She was seen by ENT and underwent inspire implantation August 02, 2022.  Inspire activation was on September 07, 2022.  Patient was set up for inspire titration study done on November 09, 2022.  Incoming voltage was 1.5 V.  There was no optimal therapeutic amplitude found.  Last visit adjustments were made.  She was changed to electrode B with stimulation level is 0.7 V, range 0.7 to 1.1 V.  Start delay was extended to 45 minutes.   Since last visit patient is feeling some better with new setting . Does request to have start time extended to 1 hr.  Stimulation test in office today appeared good at 1.0V .  Levels 1.1 -1.2 uncomfortable .  No trouble swallowing .   Allergies  Allergen Reactions   Hydrocodone Nausea And Vomiting   Oxycodone  Nausea And Vomiting   Pantoprazole     GI issues   Tizanidine     GI issues   Wellbutrin [Bupropion] Swelling    Ear swelling    Immunization History  Administered Date(s) Administered   Fluad Quad(high Dose 65+) 10/20/2022    Influenza Inj Mdck Quad Pf 10/21/2016   Influenza Split 01/20/2013   Influenza,inj,Quad PF,6+ Mos 10/11/2014, 11/05/2015   Influenza-Unspecified 10/11/2014, 11/05/2015   PFIZER Comirnaty(Gray Top)Covid-19 Tri-Sucrose Vaccine 10/20/2022   Pneumococcal Conjugate-13 12/03/2014   Pneumococcal Polysaccharide-23 11/22/2012   Tdap 01/20/2013   Zoster, Live 10/20/2015    Past Medical History:  Diagnosis Date   Anxiety    Arthritis    right shoulder   Cancer (HCC)    skin   GERD (gastroesophageal reflux disease)    Hyperlipidemia    Hypertension    Hypothyroidism    Motion sickness    PONV (postoperative nausea and vomiting)    Pre-diabetes    Sciatica of right side    Sleep apnea    Spinal stenosis of lumbar region    Thyroid  disease    under active thyroid  problems   Vitamin D  deficiency     Tobacco History: Social History   Tobacco Use  Smoking Status Former   Current packs/day: 0.00   Average packs/day: 1 pack/day for 15.0 years (15.0 ttl pk-yrs)   Types: Cigarettes   Start date: 01/12/1988   Quit date: 01/12/2003   Years since quitting: 20.0  Smokeless Tobacco Never   Counseling given: Not Answered   Outpatient Medications Prior to Visit  Medication Sig Dispense Refill   ACCU-CHEK GUIDE test strip USE TO TEST 1 (  ONE) EACH FOR ONCE DAILY GLUCOSE MONITORING     Accu-Chek Softclix Lancets lancets SMARTSIG:1 Each Topical As Directed     acetaminophen  (TYLENOL ) 650 MG CR tablet Take 1,300 mg by mouth See admin instructions. Take 1300 mg in the morning, may take a second 1300 mg dose as needed for pain     ALPRAZolam  (XANAX ) 0.25 MG tablet Take 1 tablet by mouth once daily as needed for panic attacks (Patient taking differently: Take 0.25 mg by mouth 2 (two) times daily.) 10 tablet 0   aspirin 81 MG chewable tablet Chew 81 mg by mouth daily.     Coenzyme Q10 (COQ10) 200 MG CAPS Take 200 mg by mouth daily.     diclofenac Sodium (VOLTAREN) 1 % GEL Apply 1 Application  topically 4 (four) times daily as needed (pain).     diphenhydramine -acetaminophen  (TYLENOL  PM) 25-500 MG TABS tablet Take 2 tablets by mouth at bedtime.     gabapentin (NEURONTIN) 100 MG capsule Take 100 mg by mouth 3 (three) times daily.     Homeopathic Products (LEG CRAMP RELIEF PO) Take 2 tablets by mouth 2 (two) times daily as needed (leg cramp).     levothyroxine  (SYNTHROID , LEVOTHROID) 100 MCG tablet TAKE 1 TABLET BY MOUTH EVERY DAY 30 tablet 0   omeprazole  (PRILOSEC) 40 MG capsule Take 40 mg by mouth daily.     rosuvastatin  (CRESTOR ) 10 MG tablet Take 10 mg by mouth daily.     valsartan (DIOVAN) 320 MG tablet Take 320 mg by mouth daily.     lidocaine  4 % Place 1 patch onto the skin daily as needed (pain).     No facility-administered medications prior to visit.     Review of Systems:   Constitutional:   No  weight loss, night sweats,  Fevers, chills, +fatigue, or  lassitude.  HEENT:   No headaches,  Difficulty swallowing,  Tooth/dental problems, or  Sore throat,                No sneezing, itching, ear ache, nasal congestion, post nasal drip,   CV:  No chest pain,  Orthopnea, PND, swelling in lower extremities, anasarca, dizziness, palpitations, syncope.   GI  No heartburn, indigestion, abdominal pain, nausea, vomiting, diarrhea, change in bowel habits, loss of appetite, bloody stools.   Resp: No shortness of breath with exertion or at rest.  No excess mucus, no productive cough,  No non-productive cough,  No coughing up of blood.  No change in color of mucus.  No wheezing.  No chest wall deformity  Skin: no rash or lesions.  GU: no dysuria, change in color of urine, no urgency or frequency.  No flank pain, no hematuria   MS:  No joint pain or swelling.  No decreased range of motion.  No back pain.    Physical Exam  BP 108/80 (BP Location: Left Arm, Patient Position: Sitting, Cuff Size: Normal)   Pulse 69   Ht 5' 7 (1.702 m)   Wt 167 lb 6.4 oz (75.9 kg)   LMP  01/11/1994   SpO2 97%   BMI 26.22 kg/m   GEN: A/Ox3; pleasant , NAD, well nourished    HEENT:  /AT, NOSE-clear, THROAT-clear, no lesions, no postnasal drip or exudate noted.   NECK:  Supple w/ fair ROM; no JVD; normal carotid impulses w/o bruits; no thyromegaly or nodules palpated; no lymphadenopathy.    RESP  Clear  P & A; w/o, wheezes/ rales/ or rhonchi. no accessory  muscle use, no dullness to percussion  CARD:  RRR, no m/r/g, no peripheral edema, pulses intact, no cyanosis or clubbing.  GI:   Soft & nt; nml bowel sounds; no organomegaly or masses detected.   Musco: Warm bil, no deformities or joint swelling noted.   Neuro: alert, no focal deficits noted.    Skin: Warm, no lesions or rashes    Lab Results:   BNP No results found for: BNP  ProBNP No results found for: PROBNP  Imaging: No results found.  Administration History     None           No data to display          No results found for: NITRICOXIDE      Assessment & Plan:   OSA (obstructive sleep apnea) Severe OSA (CPAP intolerant ) . S/p inspire implantation 08/02/22 . Perceived benefit with Inspire . Comfortable levels at 1.0 V .  Improvement with new levels. Will extend start time to 1 hr.  She is to titrate 0.9 to 1.0 V. As comfortable.  Check on HST (Alice ) in 3 weeks  on [employer]  . Patient Instructions  Continue on Inspire each night, use all night long .  Titrate as discussed and as comfortable.  Call if you have any questions.  HST in 3 weeks (Alice device)  Follow up in 6-8  weeks with Dr. Neda or Vineeth Fell NP . [facility] will call you )         Madelin Stank, NP 01/21/2023

## 2023-01-21 NOTE — Patient Instructions (Addendum)
 Continue on Inspire each night, use all night long .  Titrate as discussed and as comfortable.  Call if you have any questions.  HST in 3 weeks (Alice device)  Follow up in 6-8  weeks with Dr. Wynona Neat or Ramesh Moan NP . [facility] will call you )

## 2023-01-21 NOTE — Assessment & Plan Note (Signed)
 Severe OSA (CPAP intolerant ) . S/p inspire implantation 08/02/22 . Perceived benefit with Inspire . Comfortable levels at 1.0 V .  Improvement with new levels. Will extend start time to 1 hr.  She is to titrate 0.9 to 1.0 V. As comfortable.  Check on HST (Alice ) in 3 weeks  on [employer]  . Patient Instructions  Continue on Inspire each night, use all night long .  Titrate as discussed and as comfortable.  Call if you have any questions.  HST in 3 weeks (Alice device)  Follow up in 6-8  weeks with Dr. Neda or Jams Trickett NP . [facility] will call you )

## 2023-02-11 ENCOUNTER — Ambulatory Visit: Payer: Medicare Other

## 2023-02-11 DIAGNOSIS — G4733 Obstructive sleep apnea (adult) (pediatric): Secondary | ICD-10-CM

## 2023-02-15 DIAGNOSIS — G4733 Obstructive sleep apnea (adult) (pediatric): Secondary | ICD-10-CM | POA: Diagnosis not present

## 2023-03-04 ENCOUNTER — Telehealth: Payer: Self-pay | Admitting: *Deleted

## 2023-03-04 ENCOUNTER — Encounter: Payer: Self-pay | Admitting: Adult Health

## 2023-03-04 ENCOUNTER — Ambulatory Visit: Payer: Medicare Other | Admitting: Adult Health

## 2023-03-04 VITALS — BP 130/90 | HR 70 | Temp 97.8°F | Ht 67.0 in | Wt 169.0 lb

## 2023-03-04 DIAGNOSIS — G4733 Obstructive sleep apnea (adult) (pediatric): Secondary | ICD-10-CM

## 2023-03-04 NOTE — Patient Instructions (Signed)
Continue on Inspire each night, use all night long .  HST prior to next visit  Follow up in 1 year and As needed  -Dr. Wynona Neat

## 2023-03-04 NOTE — Telephone Encounter (Signed)
Patient will need 1 year follow up after HST.

## 2023-03-04 NOTE — Assessment & Plan Note (Signed)
Severe obstructive sleep apnea CPAP intolerant status post inspire implantation July 2024.  Patient changed over to electrode B December 2024 with improved clinical perceived benefit.  Follow-up home sleep study shows improved control with decreased AHI and number of sleep apneic events.  O2 saturations above 90% during home sleep study patient has excellent compliance on inspire and improved clinical symptom control.  Patient is to continue on current settings.  Will follow-up in 1 year.  Home sleep study prior to next follow-up

## 2023-03-04 NOTE — Progress Notes (Addendum)
 @Patient  ID: Darlene Mcdowell, female    DOB: 07-15-1950, 73 y.o.   MRN: [id number]  Chief Complaint  Patient presents with   Follow-up    Referring provider: Arlyss Queen  HPI: 73 year old female seen for sleep consult September 07, 2022 for sleep apnea.  Patient is CPAP intolerant and underwent inspire implantation August 02, 2022.   TEST/EVENTS :  12/2021 home sleep study demonstrating an AHI of 35.8. Drug induced sleep endoscopy 06/15/22 -75% anterior posterior collapse.  Hypoglossal nerve stimulator placement 08/02/22    INSPIRE activation 09/07/22 :  Stimulation: Sensation level at 0.5 V and functional level at 0.9 V.  Range it was set at 0.9 to 1.9 V.  Start delay at 30 minutes, pause time 15 minutes and duration at 8 hours.  Pulse width at 90/rate at 33 Hz.   inspire titration study done on November 09, 2022. Incoming voltage was 1.5 V. There was no optimal therapeutic amplitude found.   12/2022 -changed to electrode B with stimulation level is 0.7 V, range 0.7 to 1.1 V.   03/08/2023 Follow up : OSA Patient presents for 1 month follow-up.  Patient has underlying severe sleep apnea.  She was CPAP intolerant.  Underwent inspire implantation August 02, 2022.  Activation on September 07, 2022.  She had her inspire titration study October 2024.  Unable to find optimal therapeutic amplitude.  Follow-up visit she was changed to electrode B with stimulation level 0.7 to 1.1 V.  Patient had clinical improvement and was tolerating at 0.9 V.  Follow-up home sleep study showed significant improvement with AHI at 8.2/hour and SpO2 low at 90%.  Patient says she definitely is feeling better.  She is not as sleepy and feels that she is benefiting from inspire.  Inspire download shows excellent compliance with daily average usage at 9 hours.  Currently on 0.9 V/level 3.  She also has an Aura ring and says it is showing that she has improved sleep quality.  Inspire stimulation test today in office shows  normal tongue protrusion.  Allergies  Allergen Reactions   Hydrocodone Nausea And Vomiting   Oxycodone Nausea And Vomiting   Pantoprazole     GI issues   Tizanidine     GI issues   Wellbutrin [Bupropion] Swelling    Ear swelling    Immunization History  Administered Date(s) Administered   Fluad Quad(high Dose 65+) 10/20/2022   Influenza Inj Mdck Quad Pf 10/21/2016   Influenza Split 01/20/2013   Influenza,inj,Quad PF,6+ Mos 10/11/2014, 11/05/2015   Influenza-Unspecified 10/11/2014, 11/05/2015   PFIZER Comirnaty(Gray Top)Covid-19 Tri-Sucrose Vaccine 10/20/2022   Pneumococcal Conjugate-13 12/03/2014   Pneumococcal Polysaccharide-23 11/22/2012   Tdap 01/20/2013   Zoster, Live 10/20/2015    Past Medical History:  Diagnosis Date   Anxiety    Arthritis    right shoulder   Cancer (HCC)    skin   GERD (gastroesophageal reflux disease)    Hyperlipidemia    Hypertension    Hypothyroidism    Motion sickness    PONV (postoperative nausea and vomiting)    Pre-diabetes    Sciatica of right side    Sleep apnea    Spinal stenosis of lumbar region    Thyroid disease    under active thyroid problems   Vitamin D deficiency     Tobacco History: Social History   Tobacco Use  Smoking Status Former   Current packs/day: 0.00   Average packs/day: 1 pack/day for 15.0 years (15.0 ttl pk-yrs)  Types: Cigarettes   Start date: 01/12/1988   Quit date: 01/12/2003   Years since quitting: 20.1  Smokeless Tobacco Never   Counseling given: Not Answered   Outpatient Medications Prior to Visit  Medication Sig Dispense Refill   ACCU-CHEK GUIDE test strip USE TO TEST 1 (ONE) EACH FOR ONCE DAILY GLUCOSE MONITORING     Accu-Chek Softclix Lancets lancets SMARTSIG:1 Each Topical As Directed     ALPRAZolam (XANAX) 0.25 MG tablet Take 1 tablet by mouth once daily as needed for panic attacks (Patient taking differently: Take 0.25 mg by mouth 2 (two) times daily.) 10 tablet 0   aspirin 81 MG  chewable tablet Chew 81 mg by mouth daily.     Coenzyme Q10 (COQ10) 200 MG CAPS Take 200 mg by mouth daily.     diclofenac Sodium (VOLTAREN) 1 % GEL Apply 1 Application topically 4 (four) times daily as needed (pain).     gabapentin (NEURONTIN) 100 MG capsule Take 100 mg by mouth 3 (three) times daily.     Homeopathic Products (LEG CRAMP RELIEF PO) Take 2 tablets by mouth 2 (two) times daily as needed (leg cramp).     Ibuprofen-diphenhydrAMINE Cit (ADVIL PM PO) Take 2 tablets by mouth at bedtime as needed.     levothyroxine (SYNTHROID) 75 MCG tablet Take 75 mcg by mouth daily.     omeprazole (PRILOSEC) 40 MG capsule Take 40 mg by mouth daily.     rosuvastatin (CRESTOR) 10 MG tablet Take 10 mg by mouth daily.     valsartan (DIOVAN) 320 MG tablet Take 320 mg by mouth daily.     levothyroxine (SYNTHROID, LEVOTHROID) 100 MCG tablet TAKE 1 TABLET BY MOUTH EVERY DAY 30 tablet 0   acetaminophen (TYLENOL) 650 MG CR tablet Take 1,300 mg by mouth See admin instructions. Take 1300 mg in the morning, may take a second 1300 mg dose as needed for pain (Patient not taking: Reported on 03/04/2023)     diphenhydramine-acetaminophen (TYLENOL PM) 25-500 MG TABS tablet Take 2 tablets by mouth at bedtime. (Patient not taking: Reported on 03/04/2023)     No facility-administered medications prior to visit.     Review of Systems:   Constitutional:   No  weight loss, night sweats,  Fevers, chills, fatigue, or  lassitude.  HEENT:   No headaches,  Difficulty swallowing,  Tooth/dental problems, or  Sore throat,                No sneezing, itching, ear ache, nasal congestion, post nasal drip,   CV:  No chest pain,  Orthopnea, PND, swelling in lower extremities, anasarca, dizziness, palpitations, syncope.   GI  No heartburn, indigestion, abdominal pain, nausea, vomiting, diarrhea, change in bowel habits, loss of appetite, bloody stools.   Resp: No shortness of breath with exertion or at rest.  No excess mucus, no  productive cough,  No non-productive cough,  No coughing up of blood.  No change in color of mucus.  No wheezing.  No chest wall deformity  Skin: no rash or lesions.  GU: no dysuria, change in color of urine, no urgency or frequency.  No flank pain, no hematuria   MS:  No joint pain or swelling.  No decreased range of motion.  No back pain.    Physical Exam  BP (!) 130/90 (BP Location: Right Arm, Patient Position: Sitting, Cuff Size: Normal)   Pulse 70   Temp 97.8 F (36.6 C) (Oral)   Ht 5\' 7"  (1.702  m)   Wt 169 lb (76.7 kg)   LMP 01/11/1994   SpO2 99%   BMI 26.47 kg/m   GEN: A/Ox3; pleasant , NAD, well nourished    HEENT:  [place]/AT,  NOSE-clear, THROAT-clear, no lesions, no postnasal drip or exudate noted.   NECK:  Supple w/ fair ROM; no JVD; normal carotid impulses w/o bruits; no thyromegaly or nodules palpated; no lymphadenopathy.    RESP  Clear  P & A; w/o, wheezes/ rales/ or rhonchi. no accessory muscle use, no dullness to percussion  CARD:  RRR, no m/r/g, no peripheral edema, pulses intact, no cyanosis or clubbing.  GI:   Soft & nt; nml bowel sounds; no organomegaly or masses detected.   Musco: Warm bil, no deformities or joint swelling noted.   Neuro: alert, no focal deficits noted.    Skin: Warm, no lesions or rashes    Lab Results:  CBC  BNP No results found for: "BNP"  ProBNP No results found for: "PROBNP"  Imaging: No results found.  Administration History     None           No data to display          No results found for: "NITRICOXIDE"      Assessment & Plan:   OSA (obstructive sleep apnea) Severe obstructive sleep apnea CPAP intolerant status post inspire implantation July 2024.  Patient changed over to electrode B December 2024 with improved clinical perceived benefit.  Follow-up home sleep study shows improved control with decreased AHI and number of sleep apneic events.  O2 saturations above 90% during home sleep study  patient has excellent compliance on inspire and improved clinical symptom control.  Patient is to continue on current settings.  Will follow-up in 1 year.  Home sleep study prior to next follow-up      Rubye Oaks, NP 03/08/2023

## 2023-03-11 ENCOUNTER — Emergency Department
Admission: EM | Admit: 2023-03-11 | Discharge: 2023-03-11 | Disposition: A | Discharge status: Home / Self Care | Payer: Medicare Other | Attending: Emergency Medicine | Admitting: Emergency Medicine

## 2023-03-11 ENCOUNTER — Other Ambulatory Visit: Payer: Self-pay

## 2023-03-11 DIAGNOSIS — N9089 Other specified noninflammatory disorders of vulva and perineum: Secondary | ICD-10-CM

## 2023-03-11 DIAGNOSIS — N939 Abnormal uterine and vaginal bleeding, unspecified: Secondary | ICD-10-CM | POA: Insufficient documentation

## 2023-03-11 LAB — CBC
HCT: 40.2 % (ref 36.0–46.0)
Hemoglobin: 13.7 g/dL (ref 12.0–15.0)
MCH: 29.9 pg (ref 26.0–34.0)
MCHC: 34.1 g/dL (ref 30.0–36.0)
MCV: 87.8 fL (ref 80.0–100.0)
Platelets: 204 10*3/uL (ref 150–400)
RBC: 4.58 MIL/uL (ref 3.87–5.11)
RDW: 14.1 % (ref 11.5–15.5)
WBC: 9.2 10*3/uL (ref 4.0–10.5)
nRBC: 0 % (ref 0.0–0.2)

## 2023-03-11 LAB — BASIC METABOLIC PANEL
Anion gap: 13 (ref 5–15)
BUN: 16 mg/dL (ref 8–23)
CO2: 21 mmol/L — ABNORMAL LOW (ref 22–32)
Calcium: 9.9 mg/dL (ref 8.9–10.3)
Chloride: 105 mmol/L (ref 98–111)
Creatinine, Ser: 0.86 mg/dL (ref 0.44–1.00)
GFR, Estimated: >60 mL/min (ref >60)
Glucose, Bld: 115 mg/dL — ABNORMAL HIGH (ref 70–99)
Potassium: 3.9 mmol/L (ref 3.5–5.1)
Sodium: 139 mmol/L (ref 135–145)

## 2023-03-11 LAB — TYPE AND SCREEN
ABO/RH(D): A POS
Antibody Screen: NEGATIVE

## 2023-03-11 MED ORDER — LIDOCAINE HCL (PF) 1 % IJ SOLN: 5.0000 mL | Freq: Once | INTRAMUSCULAR | Status: AC

## 2023-03-11 MED ORDER — LIDOCAINE HCL (PF) 1 % IJ SOLN
INTRAMUSCULAR | Status: AC
  Administered 2023-03-11: 5 mL
  Filled 2023-03-11: qty 5

## 2023-03-11 NOTE — Discharge Instructions (Signed)
 You are seen in the emergency for bleeding from your biopsy site.  We placed 3 stitches that stopped the bleeding.  Follow-up with your gynecologist so they can reevaluate the biopsy site, your sutures will need to be removed in 5 to 7 days.  Return for return of bleeding.  Thank you for choosing [place] for your health care, it was my pleasure to care for you today!  Corena Herter, MD

## 2023-03-11 NOTE — ED Triage Notes (Addendum)
 Pt comes with c/o vaginal bleeding since yesterday. Pt states she had biopsy done yesterday and today it has been excessive. Pt states she has gone through multiple pads and it is gushing of blood especially every time she stands up.  Pt does appear little pale. Pt takes baby aspirin daily.

## 2023-03-11 NOTE — ED Provider Notes (Signed)
 [facility] Provider Note    Event Date/Time   First MD Initiated Contact with Patient 03/11/23 1554     (approximate)   History   Vaginal Bleeding   HPI  Darlene Mcdowell is a 73 y.o. female presents to the emergency department with vaginal bleeding.  Patient states that she had a biopsy done of her vulva yesterday with Dr. Feliberto Gottron for concern for ongoing vulvar pain and itching.  States that since she got home inside had significantly bright red blood in breathing.  Called gynecology and was unable to get an appointment today and was told to come to the emergency department.  Not on anticoagulation.  Takes a daily aspirin.  Does not believe that she is having any internal bleeding from her uterus and states that she only had a biopsy done of her vulva.  Denies lightheadedness or shortness of breath.  Prior surgeries in the past they were unremarkable.  No bleeding postoperatively.     Physical Exam   Triage Vital Signs: ED Triage Vitals  Encounter Vitals Group     BP 03/11/23 1539 132/87     Systolic BP Percentile --      Diastolic BP Percentile --      Pulse Rate 03/11/23 1539 93     Resp 03/11/23 1539 18     Temp 03/11/23 1539 98 F (36.7 C)     Temp src --      SpO2 03/11/23 1539 100 %     Weight 03/11/23 1537 168 lb (76.2 kg)     Height 03/11/23 1537 5\' 7"  (1.702 m)     Head Circumference --      Peak Flow --      Pain Score 03/11/23 1537 3     Pain Loc --      Pain Education --      Exclude from Growth Chart --     Most recent vital signs: Vitals:   03/11/23 1630 03/11/23 1700  BP: 117/68 116/70  Pulse: (!) 42 70  Resp:    Temp:    SpO2:      Physical Exam Exam conducted with a chaperone present.  Constitutional:      Appearance: She is well-developed.  HENT:     Head: Atraumatic.  Eyes:     Conjunctiva/sclera: Conjunctivae normal.  Cardiovascular:     Rate and Rhythm: Regular rhythm.  Pulmonary:     Effort: No  respiratory distress.  Abdominal:     General: There is no distension.  Genitourinary:    Comments: Punch biopsy site to the left labia majora that is hemostatic.  Punch biopsy to the right labia majora with bright red blood and active bleeding.  No active bleeding from her vagina. Musculoskeletal:        General: Normal range of motion.     Cervical back: Normal range of motion.  Skin:    General: Skin is warm.  Neurological:     Mental Status: She is alert. Mental status is at baseline.      IMPRESSION / MDM / ASSESSMENT AND PLAN / ED COURSE  I reviewed the triage vital signs and the nursing notes.  Differential diagnosis including vaginal bleeding, bleeding from punch biopsy site, anemia   Labs (all labs ordered are listed, but only abnormal results are displayed) Labs interpreted as -    Labs Reviewed  BASIC METABOLIC PANEL - Abnormal; Notable for the following components:  Result Value   CO2 21 (*)    Glucose, Bld 115 (*)    All other components within normal limits  CBC  TYPE AND SCREEN   Hemoglobin stable at 13.7.  Not on anticoagulation.  3 sutures were placed to the punch biopsy site with hemostasis.  Patient was monitored after sutures were placed and on reevaluation continued to have no further bleeding and was hemostatic.  Patient has an appointment on Monday with her gynecologist, appointment is already scheduled, discussed need for suture removal in 5 to 7 days.  Discussed return to the emergency department if she had any recurrence of bleeding.      PROCEDURES:  Critical Care performed: No  .Laceration Repair  Date/Time: 03/11/2023 7:13 PM  Performed by: Corena Herter, MD Authorized by: Corena Herter, MD   Consent:    Consent obtained:  Verbal   Consent given by:  Patient   Risks, benefits, and alternatives were discussed: yes     Risks discussed:  Pain, nerve damage, poor wound healing, poor cosmetic result, vascular damage, tendon damage,  infection and need for additional repair   Alternatives discussed:  Delayed treatment and no treatment Universal protocol:    Procedure explained and questions answered to patient or proxy's satisfaction: yes     Relevant documents present and verified: yes     Test results available: yes     Imaging studies available: yes     Required blood products, implants, devices, and special equipment available: yes     Patient identity confirmed:  Verbally with patient Anesthesia:    Anesthesia method:  Local infiltration   Local anesthetic:  Lidocaine 1% w/o epi Laceration details:    Location:  Anogenital   Anogenital location:  Vulva   Length (cm):  0.5 Pre-procedure details:    Preparation:  Patient was prepped and draped in usual sterile fashion Treatment:    Area cleansed with:  Povidone-iodine   Amount of cleaning:  Standard Skin repair:    Repair method:  Sutures   Suture size:  4-0   Suture material:  Prolene   Suture technique:  Simple interrupted   Number of sutures:  3 Approximation:    Approximation:  Close Repair type:    Repair type:  Intermediate Post-procedure details:    Dressing:  Sterile dressing   Procedure completion:  Tolerated well, no immediate complications   Patient's presentation is most consistent with acute complicated illness / injury requiring diagnostic workup.   MEDICATIONS ORDERED IN ED: Medications  lidocaine (PF) (XYLOCAINE) 1 % injection 5 mL (5 mLs Other Given by Other 03/11/23 1646)    FINAL CLINICAL IMPRESSION(S) / ED DIAGNOSES   Final diagnoses:  Vulvar bleeding     Rx / DC Orders   ED Discharge Orders     None        Note:  This document was prepared using Dragon voice recognition software and may include unintentional dictation errors.   Corena Herter, MD 03/11/23 [phone]

## 2023-03-16 NOTE — Telephone Encounter (Signed)
 Heather - did you place a recall for this?

## 2023-04-07 NOTE — Telephone Encounter (Signed)
 I put her in for a recall.

## 2023-06-23 IMAGING — CR DG HIP (WITH OR WITHOUT PELVIS) 2-3V*R*
1 series · 3 of 3 positions shown · non-contrast
Comparison: None.

CLINICAL DATA: Fall.  Right hip pain.  Rounded

EXAM:
DG HIP (WITH OR WITHOUT PELVIS) 2-3V RIGHT

[Series 1: t pelvis ap · 0.14mm/px · 3 of 3 slices shown]
[im 1/3]
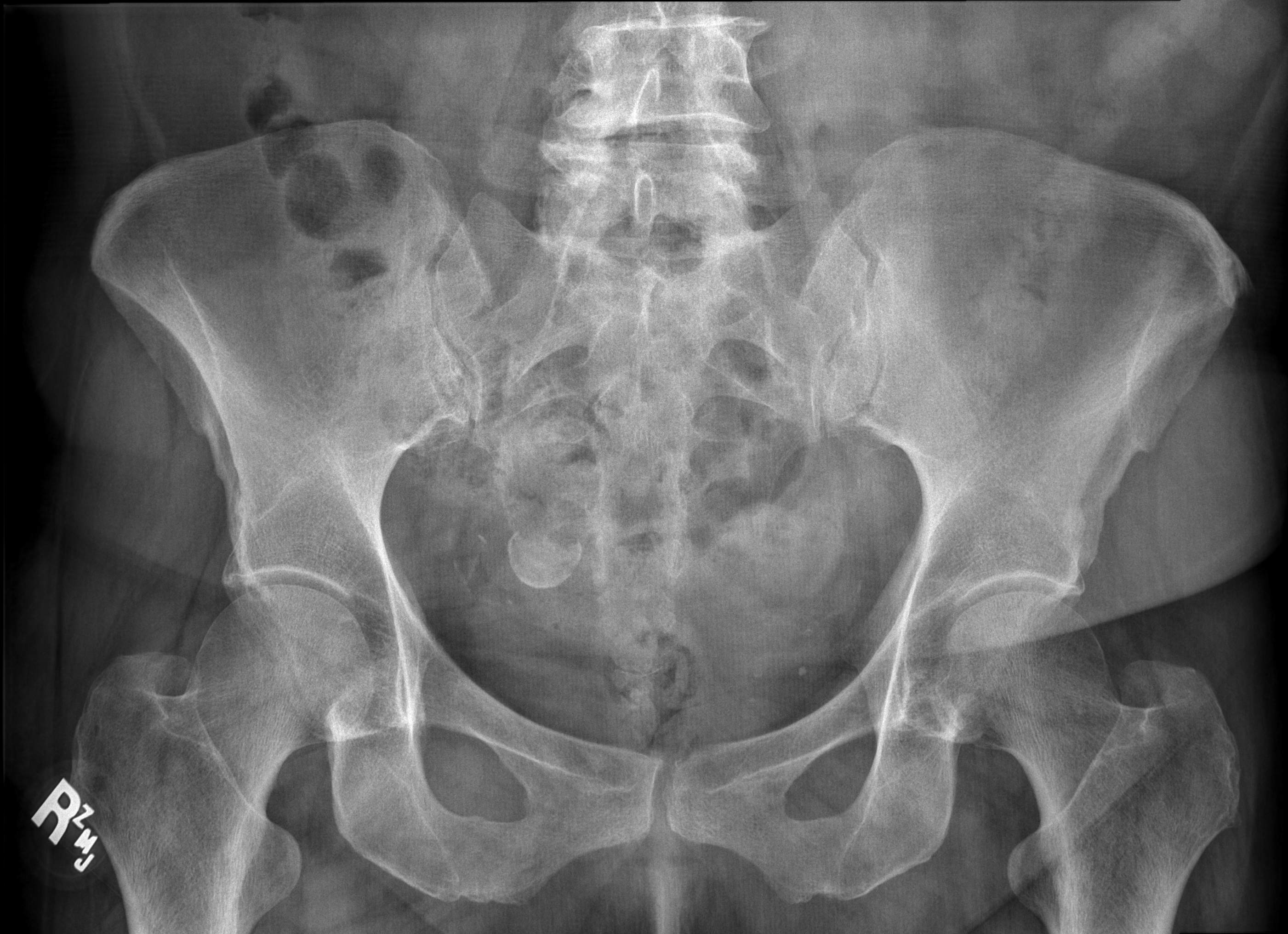
[im 2/3]
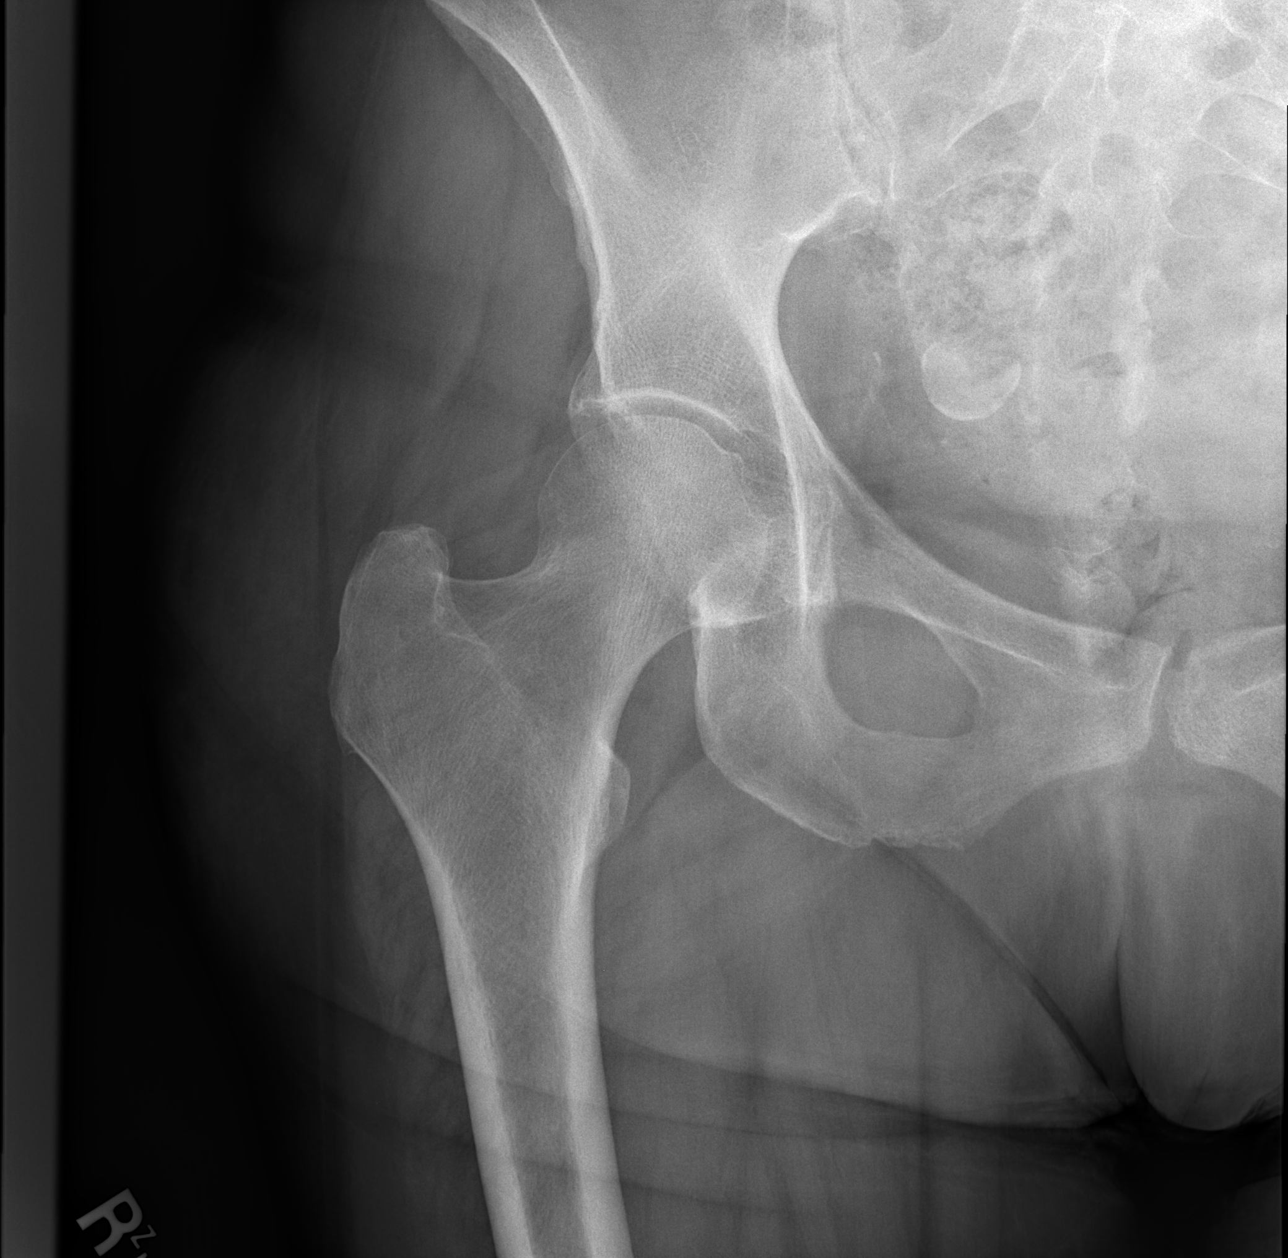
[im 3/3]
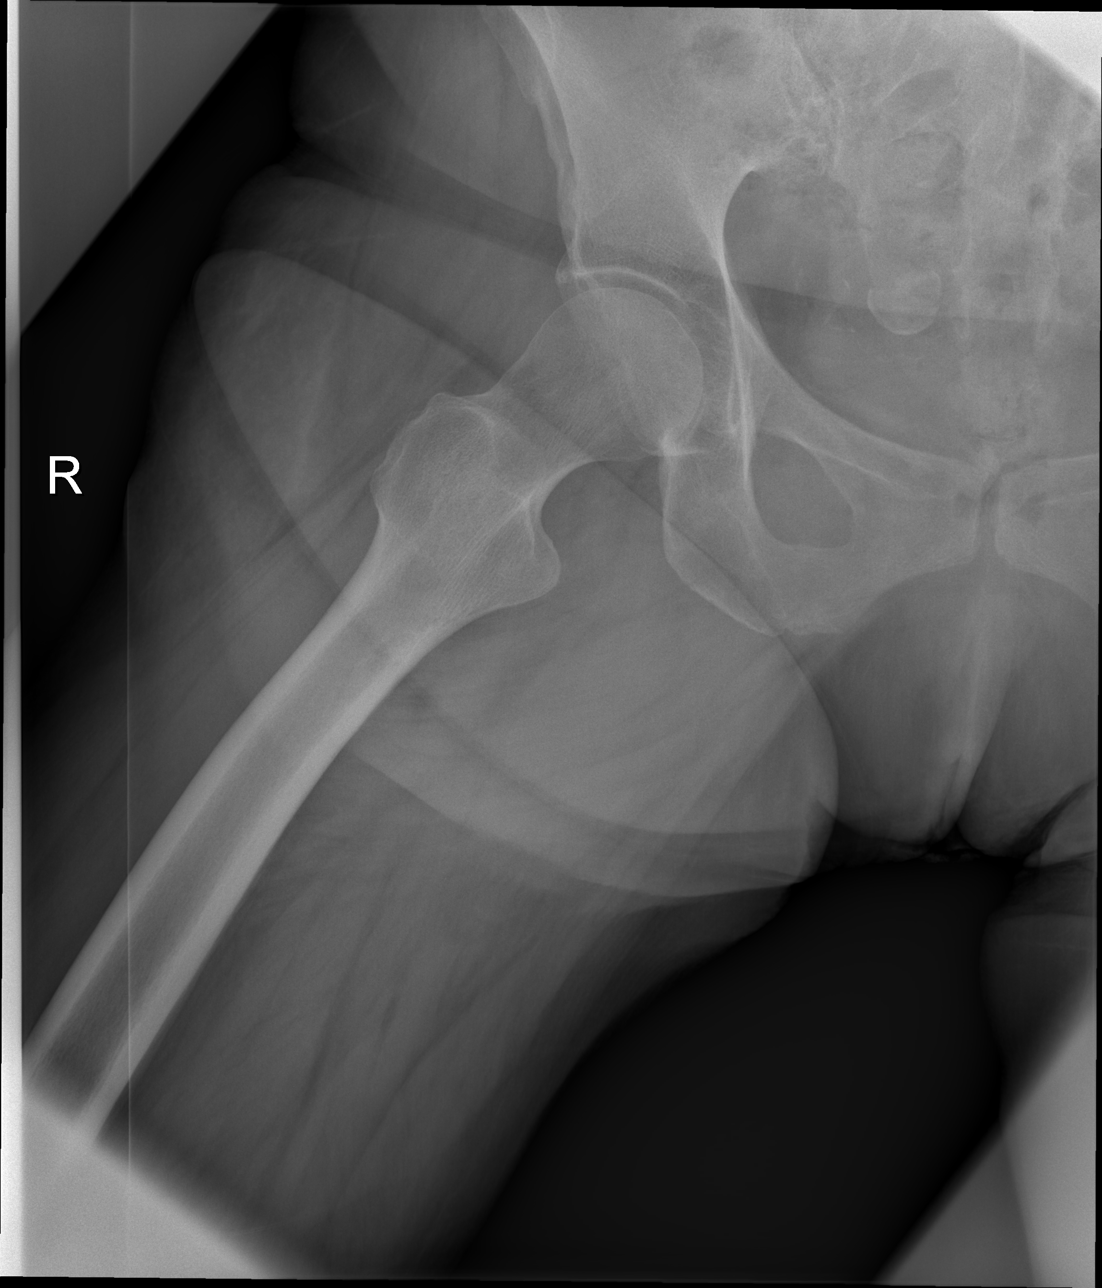

[3 of 3 positions shown; findings below may reference images not displayed]

FINDINGS: There is no evidence of hip fracture or dislocation. There is no
evidence of arthropathy or other focal bone abnormality.
Calcification over the right hemipelvis likely related to uterine
fibroid.
IMPRESSION: No evidence for an acute fracture involving the bony pelvis or right
proximal femur.

## 2023-06-23 IMAGING — CR DG WRIST COMPLETE 3+V*L*
1 series · 4 of 4 positions shown · non-contrast
Comparison: None.

CLINICAL DATA: Fall, pain

EXAM:
LEFT WRIST - COMPLETE 3+ VIEW

[Series 1: x wrist pa left · 0.14mm/px · 4 of 4 slices shown]
[im 1/4]
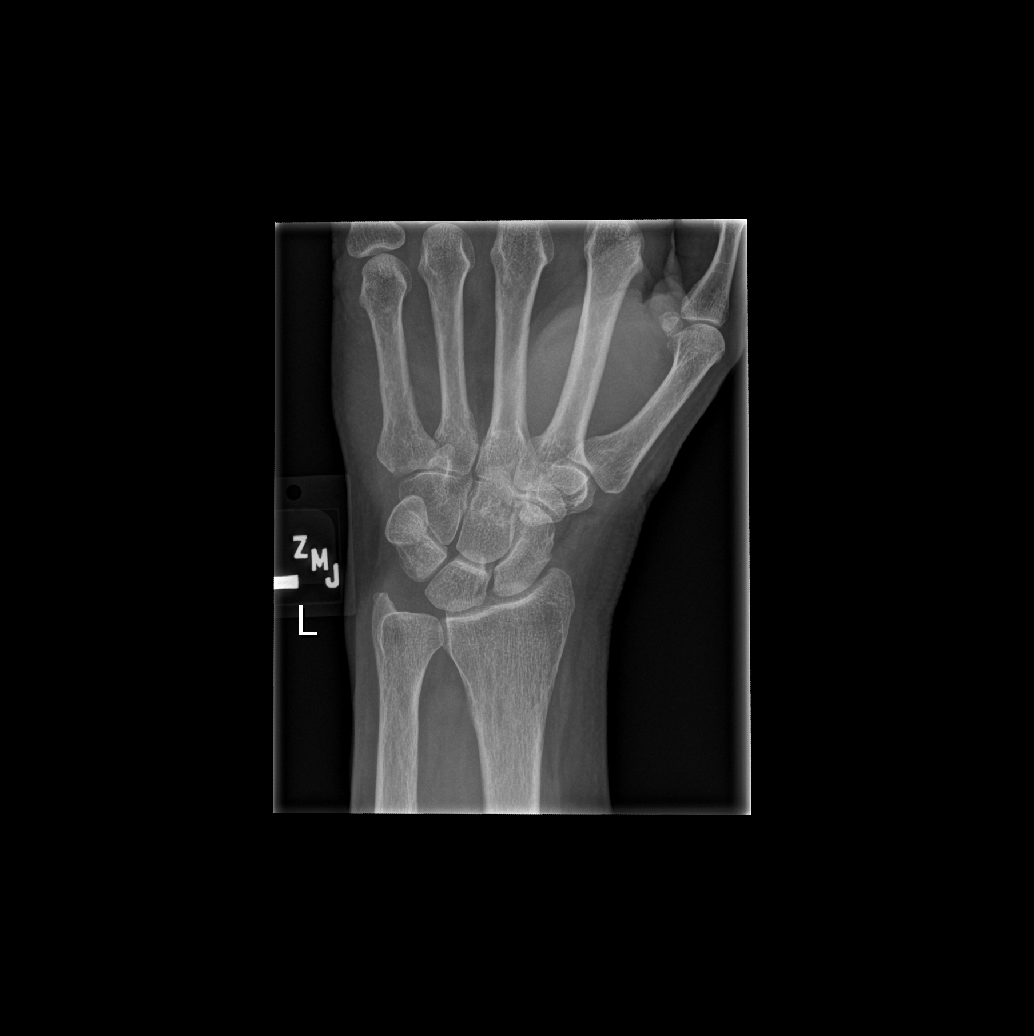
[im 2/4]
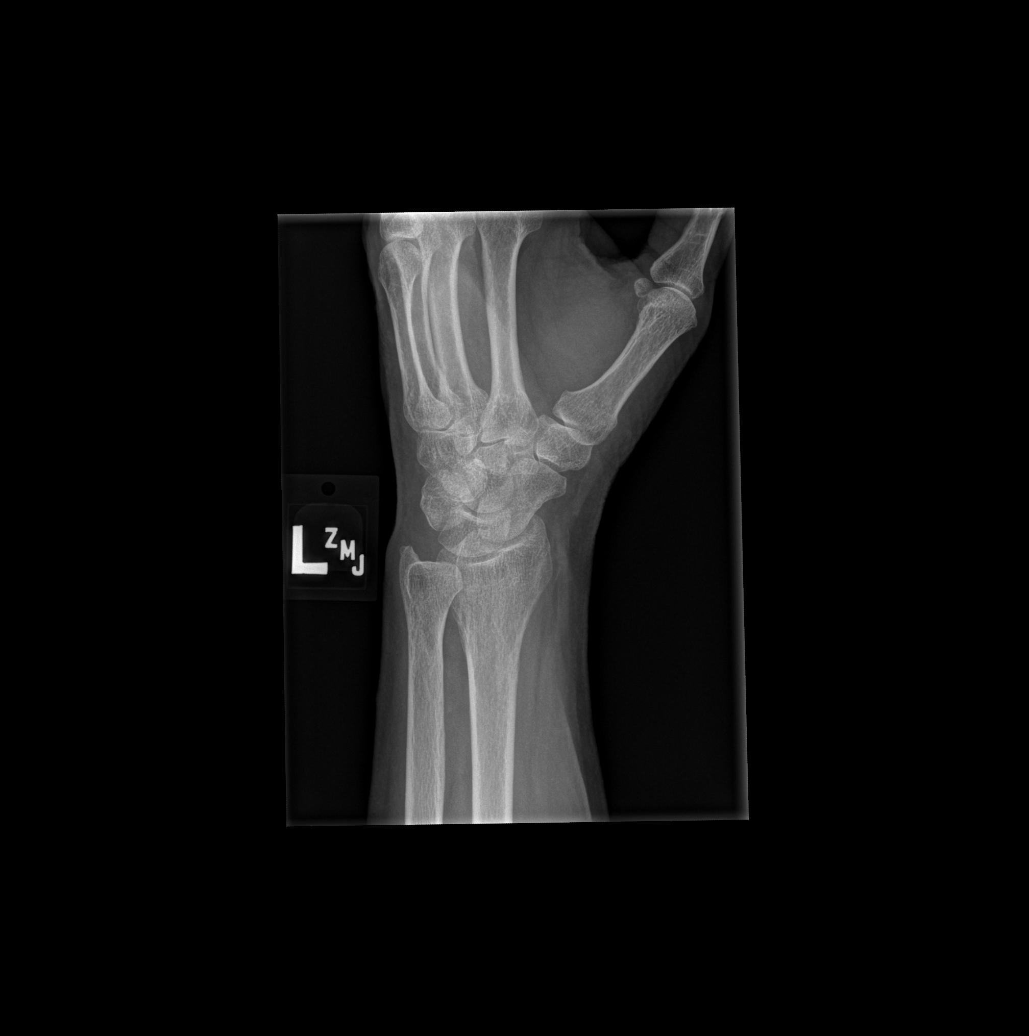
[im 3/4]
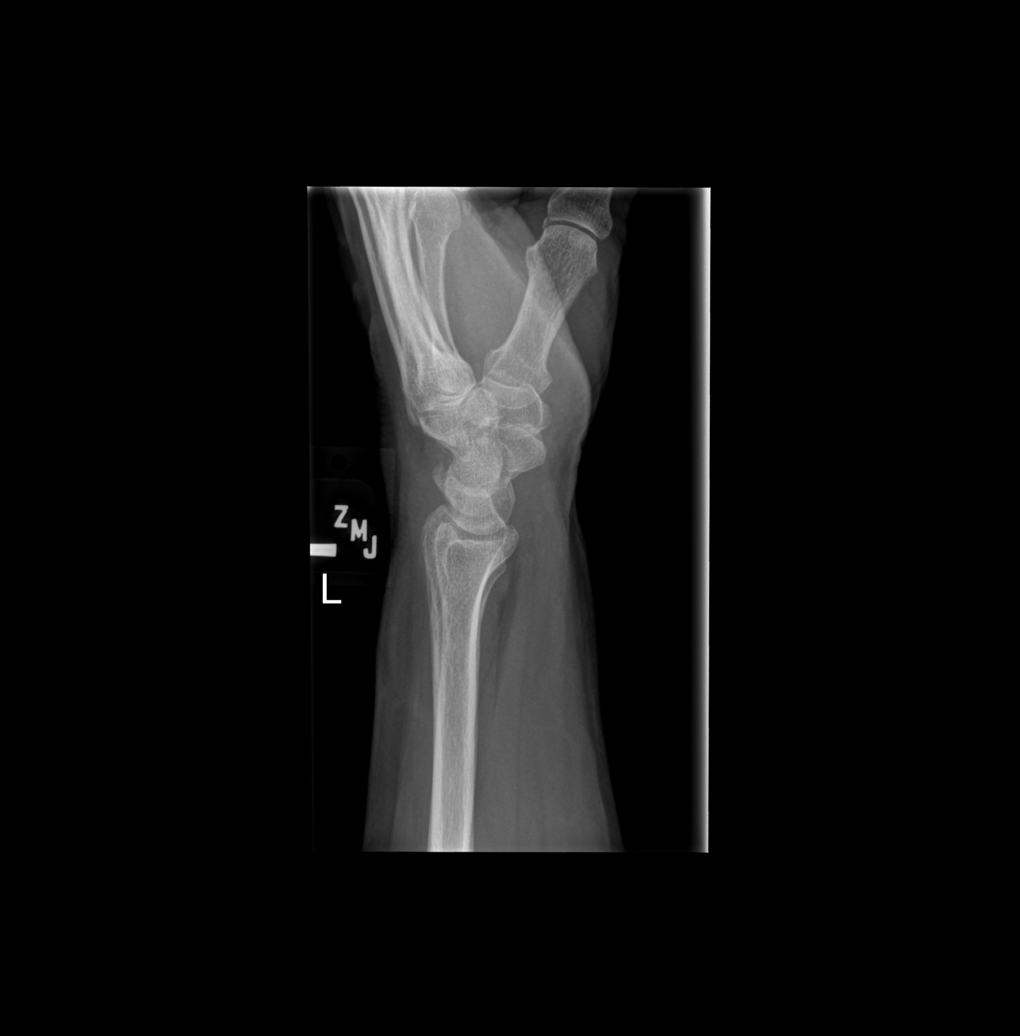
[im 4/4]
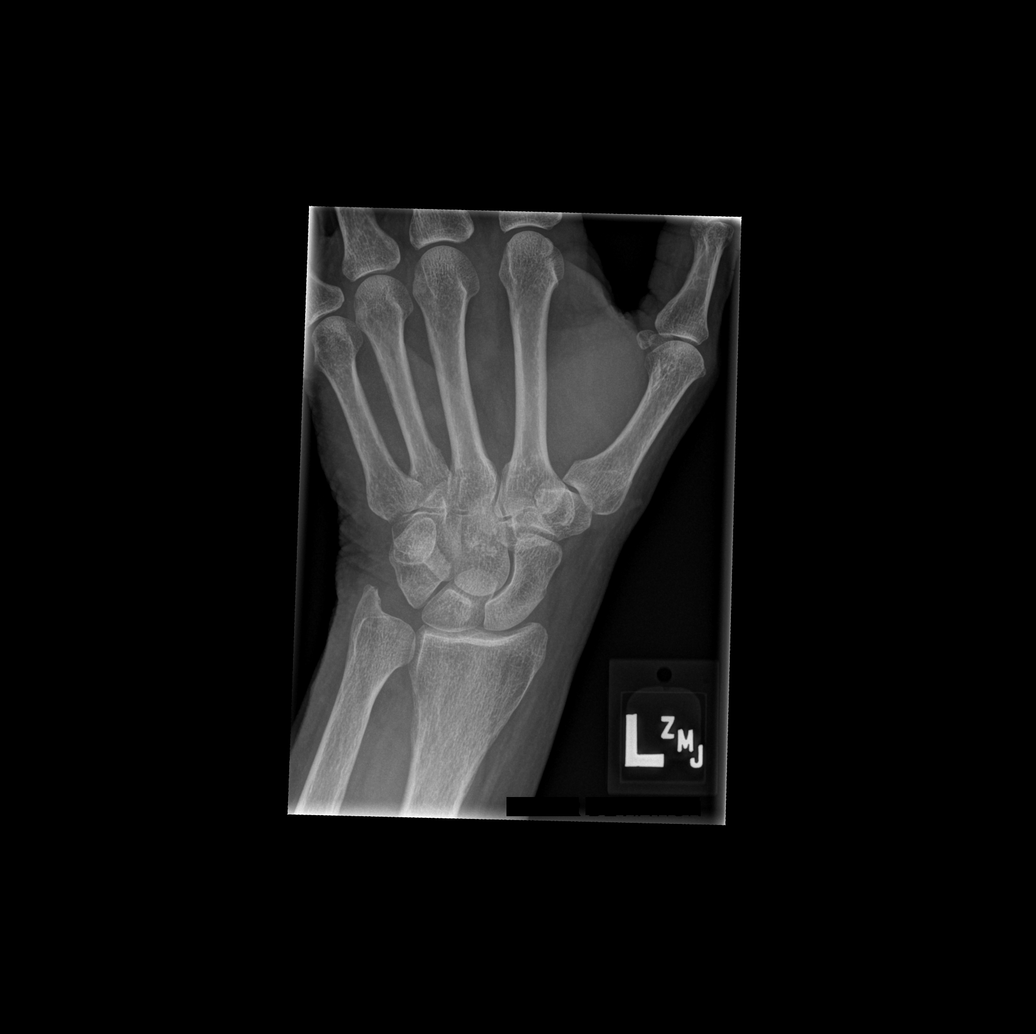

[4 of 4 positions shown; findings below may reference images not displayed]

FINDINGS: Suspect a small triquetral avulsion fracture, seen on lateral view
only. The carpus is otherwise normally aligned. No other evidence of
fracture or dislocation. Soft tissues are unremarkable.
IMPRESSION: 1. Suspect a small triquetral avulsion fracture, seen on lateral
view only.

2. The carpus is otherwise normally aligned. No other evidence of
fracture or dislocation.

## 2023-07-07 ENCOUNTER — Inpatient Hospital Stay
Admission: RE | Admit: 2023-07-07 | Discharge: 2023-07-07 | Disposition: A | Discharge status: Home / Self Care | Payer: Self-pay | Source: Ambulatory Visit | Attending: Orthopedic Surgery | Admitting: Orthopedic Surgery

## 2023-07-07 ENCOUNTER — Other Ambulatory Visit: Payer: Self-pay | Admitting: Family Medicine

## 2023-07-07 DIAGNOSIS — Z049 Encounter for examination and observation for unspecified reason: Secondary | ICD-10-CM

## 2023-07-21 ENCOUNTER — Ambulatory Visit: Admitting: Orthopedic Surgery

## 2023-07-21 ENCOUNTER — Encounter: Payer: Self-pay | Admitting: Orthopedic Surgery

## 2023-07-21 NOTE — Progress Notes (Unsigned)
 Referring Physician:  Charlene Debby BROCKS, PA-C [address] [place],  [place] [address]  Primary Physician:  Darlene Lot, PA-C  History of Present Illness: 07/26/2023 Darlene Mcdowell has a history of HTN, OSA, hypothyroidism, GAD, hyperlipidemia.   She has inspire implant for OSA.   History of lumbar surgery in 2021 and pain improved. She has been seeing [facility].   5 year history of constant right lower back pain with intermittent right posterior/lateral leg pain to her foot. Pain is worse at night. She has numbness and tingling in right leg along with cramping. She has weakness in right leg as well. No left leg pain.   She is taking neurontin. No relief with last ESI.   She does not smoke.   Bowel/Bladder Dysfunction: urinary incontinence x 1 year-sees gynecologist and is taking Gemtesa  Conservative measures:  Physical therapy:  has not participated in PT recently Multimodal medical therapy including regular antiinflammatories:  Robaxin , Meloxicam, Tylenol  Arthritis, Hyland's cramp medication, Gabapentin, Tizanidine, voltaren gel, Lidocaine  patches Injections:  05/27/2023 right L5-S1 TF and right S1 TF ESI   Past Surgery:  history of cervical spine surgery in the early 2002, where a plate and screws were placed at C6 or C7  Lumbar Laminectomy L4 and L5-2021   Darlene Mcdowell has no symptoms of cervical myelopathy.  The symptoms are causing a significant impact on the patient's life.   Review of Systems:  A 10 point review of systems is negative, except for the pertinent positives and negatives detailed in the HPI.  Past Medical History: Past Medical History:  Diagnosis Date   Anxiety    Arthritis    right shoulder   Cancer (HCC)    skin   GERD (gastroesophageal reflux disease)    Hyperlipidemia    Hypertension    Hypothyroidism    Motion sickness    PONV (postoperative nausea and vomiting)    Pre-diabetes    Sciatica of right side    Sleep apnea     Spinal stenosis of lumbar region    Thyroid  disease    under active thyroid  problems   Vitamin D  deficiency     Past Surgical History: Past Surgical History:  Procedure Laterality Date   CATARACT EXTRACTION W/PHACO Left 06/30/2021   Procedure: CATARACT EXTRACTION PHACO AND INTRAOCULAR LENS PLACEMENT (IOC) LEFT 6.29 00:38.4;  Surgeon: Jaye Fallow, MD;  Location: [facility];  Service: Ophthalmology;  Laterality: Left;  mid morning or late arrival   CATARACT EXTRACTION W/PHACO Right 12/01/2021   Procedure: CATARACT EXTRACTION PHACO AND INTRAOCULAR LENS PLACEMENT (IOC) RIGHT  5.25  00:34.2;  Surgeon: Jaye Fallow, MD;  Location: [facility];  Service: Ophthalmology;  Laterality: Right;   DRUG INDUCED ENDOSCOPY Bilateral 06/15/2022   Procedure: DRUG INDUCED SLEEP  ENDOSCOPY;  Surgeon: Carlie Clark, MD;  Location: [facility];  Service: ENT;  Laterality: Bilateral;   IMPLANTATION OF HYPOGLOSSAL NERVE STIMULATOR Right 08/02/2022   Procedure: IMPLANTATION OF HYPOGLOSSAL NERVE STIMULATOR;  Surgeon: Carlie Clark, MD;  Location: [facility];  Service: ENT;  Laterality: Right;   LUMBAR LAMINECTOMY  2021   [facility]   SPINE SURGERY  01/12/2003   cervical c-7   TUBAL LIGATION  01/12/1979    Allergies: Allergies as of 07/26/2023 - Review Complete 03/11/2023  Allergen Reaction Noted   Hydrocodone Nausea And Vomiting 11/03/2021   Oxycodone  Nausea And Vomiting 06/23/2021   Pantoprazole  11/25/2021   Tizanidine  11/25/2021  Wellbutrin [bupropion] Swelling 06/23/2021    Medications: Outpatient Encounter Medications as of 07/26/2023  Medication Sig   ACCU-CHEK GUIDE test strip USE TO TEST 1 (ONE) EACH FOR ONCE DAILY GLUCOSE MONITORING   Accu-Chek Softclix Lancets lancets SMARTSIG:1 Each Topical As Directed   ALPRAZolam  (XANAX ) 0.25 MG tablet Take 1 tablet by mouth once daily as needed for panic attacks (Patient taking  differently: Take 0.25 mg by mouth 2 (two) times daily.)   aspirin 81 MG chewable tablet Chew 81 mg by mouth daily.   Coenzyme Q10 (COQ10) 200 MG CAPS Take 200 mg by mouth daily.   diclofenac Sodium (VOLTAREN) 1 % GEL Apply 1 Application topically 4 (four) times daily as needed (pain).   gabapentin (NEURONTIN) 100 MG capsule Take 100 mg by mouth 3 (three) times daily.   Homeopathic Products (LEG CRAMP RELIEF PO) Take 2 tablets by mouth 2 (two) times daily as needed (leg cramp).   Ibuprofen-diphenhydrAMINE  Cit (ADVIL PM PO) Take 2 tablets by mouth at bedtime as needed.   levothyroxine  (SYNTHROID ) 75 MCG tablet Take 75 mcg by mouth daily.   omeprazole  (PRILOSEC) 40 MG capsule Take 40 mg by mouth daily.   rosuvastatin  (CRESTOR ) 10 MG tablet Take 10 mg by mouth daily.   valsartan (DIOVAN) 320 MG tablet Take 320 mg by mouth daily.   No facility-administered encounter medications on file as of 07/26/2023.    Social History: Social History   Tobacco Use   Smoking status: Former    Current packs/day: 0.00    Average packs/day: 1 pack/day for 15.0 years (15.0 ttl pk-yrs)    Types: Cigarettes    Start date: 01/12/1988    Quit date: 01/12/2003    Years since quitting: 20.5   Smokeless tobacco: Never  Vaping Use   Vaping status: Never Used  Substance Use Topics   Alcohol use: Not Currently   Drug use: No    Family Medical History: Family History  Problem Relation Age of Onset   Stroke Mother    Hypertension Mother    COPD Mother    Hyperlipidemia Father    Heart disease Father    Hypertension Father    Cancer Maternal Grandmother    Heart disease Maternal Grandfather    Heart disease Paternal Grandmother    Birth defects Daughter    Diabetes Daughter    Hearing loss Daughter    Learning disabilities Daughter    Vision loss Daughter    Breast cancer Neg Hx     Physical Examination: Vitals:   07/26/23 1303  BP: (!) 168/104    General: Patient is well developed, well  nourished, calm, collected, and in no apparent distress. Attention to examination is appropriate.  Respiratory: Patient is breathing without any difficulty.   NEUROLOGICAL:     Awake, alert, oriented to person, place, and time.  Speech is clear and fluent. Fund of knowledge is appropriate.   Cranial Nerves: Pupils equal round and reactive to light.  Facial tone is symmetric.    Well healed lumbar incision. No gross lumbar tenderness.   No abnormal lesions on exposed skin.   Strength: Side Biceps Triceps Deltoid Interossei Grip Wrist Ext. Wrist Flex.  R 5 5 5 5 5 5 5   L 5 5 5 5 5 5 5    Side Iliopsoas Quads Hamstring PF DF EHL  R 5 5 5  4- 3 3  L 5 5 5 5 5 5    Reflexes are 2+ and symmetric at the biceps,  brachioradialis, patella and achilles, but depressed at right achilles.   Hoffman's is absent.  Clonus is not present.   Bilateral upper and lower extremity sensation is intact to light touch.     She can heel and toe stand on left leg. She cannot toe stand on right leg and has difficulty heel standing on right as well.   Slow gait.   Medical Decision Making  Imaging: Lumbar xrays dated 05/04/23:  Diffuse lumbar spondylosis and DDD.   No report for above xrays.   Assessment and Plan: Ms. Ohm has a history of lumbar surgery in 2021.   Now with a 5 year history of constant right lower back pain with intermittent right posterior/lateral leg pain to her foot. She has numbness and tingling in right leg along with cramping. She has weakness in right leg as well. No left leg pain.   She has known diffuse lumbar spondylosis and DDD.   Per ortho note, MRI from [facility] 12/25/2021 showed postsurgical changes L4-L5, small right foraminal disc protrusion with disc herniation with caudal migration 6 mm that effaces the proximal right descending L5 nerve root. I do not have MRI report or images.   She has weakness in her right foot that she thinks has been going on awhile. She  has 4-/5 PF and 3/5 DF/EHL.   Treatment options discussed with patient and following plan made:   - MRI of lumbar spine to further evaluate pain and weakness in right leg. She has inspire implant for OSA. Card states it is MR conditional. Will verify with radiology that she can have MRI done.  - Order for AFO right leg sent to [facility] in [place].  - Will schedule follow up visit to review MRI results once I get them back.   BP was elevated. No symptoms of chest pain, shortness of breath, blurry vision, or headaches. She checks BP at home and it generally runs better. Will recheck at home and call PCP if not improved. If she develops CP, SOB, blurry vision, or headaches, then she will go to ED.     I spent a total of 45 minutes in face-to-face and non-face-to-face activities related to this patient's care today including review of outside records, review of imaging, review of symptoms, physical exam, discussion of differential diagnosis, discussion of treatment options, and documentation.   Thank you for involving me in the care of this patient.   Glade Boys PA-C Dept. of Neurosurgery

## 2023-07-25 ENCOUNTER — Other Ambulatory Visit: Payer: Self-pay | Admitting: Nurse Practitioner

## 2023-07-25 ENCOUNTER — Ambulatory Visit
Admission: RE | Admit: 2023-07-25 | Discharge: 2023-07-25 | Disposition: A | Discharge status: Home / Self Care | Source: Ambulatory Visit | Attending: Nurse Practitioner | Admitting: Nurse Practitioner

## 2023-07-25 ENCOUNTER — Ambulatory Visit
Admission: RE | Admit: 2023-07-25 | Discharge: 2023-07-25 | Disposition: A | Discharge status: Home / Self Care | Attending: Nurse Practitioner | Admitting: Nurse Practitioner

## 2023-07-25 DIAGNOSIS — M25572 Pain in left ankle and joints of left foot: Secondary | ICD-10-CM

## 2023-07-25 DIAGNOSIS — M25579 Pain in unspecified ankle and joints of unspecified foot: Secondary | ICD-10-CM | POA: Diagnosis present

## 2023-07-26 ENCOUNTER — Encounter: Payer: Self-pay | Admitting: Orthopedic Surgery

## 2023-07-26 ENCOUNTER — Ambulatory Visit: Admitting: Orthopedic Surgery

## 2023-07-26 VITALS — BP 170/102 | Ht 67.0 in | Wt 173.6 lb

## 2023-07-26 DIAGNOSIS — Z9889 Other specified postprocedural states: Secondary | ICD-10-CM

## 2023-07-26 DIAGNOSIS — R202 Paresthesia of skin: Secondary | ICD-10-CM | POA: Diagnosis not present

## 2023-07-26 DIAGNOSIS — M5416 Radiculopathy, lumbar region: Secondary | ICD-10-CM

## 2023-07-26 DIAGNOSIS — M47816 Spondylosis without myelopathy or radiculopathy, lumbar region: Secondary | ICD-10-CM | POA: Diagnosis not present

## 2023-07-26 DIAGNOSIS — M51362 Other intervertebral disc degeneration, lumbar region with discogenic back pain and lower extremity pain: Secondary | ICD-10-CM

## 2023-07-26 DIAGNOSIS — R200 Anesthesia of skin: Secondary | ICD-10-CM

## 2023-07-26 DIAGNOSIS — R29898 Other symptoms and signs involving the musculoskeletal system: Secondary | ICD-10-CM

## 2023-07-26 NOTE — Patient Instructions (Signed)
 It was so nice to see you today. Thank you so much for coming in.    You have some weakness in your right foot. I sent an order for an AFO brace to [facility] in [place]. They should call you or you can call them at [phone]  I want to get an MRI of your lower back to look into things further. We will get this approved through your insurance and [facility] will call you to schedule the appointment. Ask about your patient responsibility. You do not need to pay this prior to getting MRI, they can bill you.    [facility]patient Imaging (building with the white pillars) is located off of [address]. The address is [address], [place], [place] [address].    We will make sure you can have an MRI with your inspire implant.   After you have the MRI, it can take 14-28 days for me to get the results back. If I don't have them in 2 weeks, we will call to try to get the results.   Once I have the results, we will call you to schedule a follow up visit with me to review them.   Please do not hesitate to call if you have any questions or concerns. You can also message me in MyChart.   Your blood pressure was elevated today. I want you to recheck it at home and follow up with your PCP if it remains high. If you have any chest pain, shortness of breath, blurry vision, or headaches then you need to go to ED.    Sending you big hugs tomorrow.   Glade Boys PA-C [phone]     The physicians and staff at [facility] Neurosurgery at [facility] are committed to providing excellent care. You may receive a survey asking for feedback about your experience at our office. We value you your feedback and appreciate you taking the time to to fill it out. The [facility] leadership team is also available to discuss your experience in person, feel free to contact us  [phone].

## 2023-08-01 ENCOUNTER — Ambulatory Visit
Admission: RE | Admit: 2023-08-01 | Discharge: 2023-08-01 | Disposition: A | Discharge status: Home / Self Care | Source: Ambulatory Visit | Attending: Orthopedic Surgery | Admitting: Orthopedic Surgery

## 2023-08-01 DIAGNOSIS — Z9889 Other specified postprocedural states: Secondary | ICD-10-CM | POA: Diagnosis present

## 2023-08-01 DIAGNOSIS — M47816 Spondylosis without myelopathy or radiculopathy, lumbar region: Secondary | ICD-10-CM | POA: Insufficient documentation

## 2023-08-01 DIAGNOSIS — R29898 Other symptoms and signs involving the musculoskeletal system: Secondary | ICD-10-CM | POA: Insufficient documentation

## 2023-08-01 DIAGNOSIS — M5416 Radiculopathy, lumbar region: Secondary | ICD-10-CM | POA: Insufficient documentation

## 2023-08-05 NOTE — Telephone Encounter (Signed)
 Please refax order to [facility] for AFO.

## 2023-08-08 NOTE — Progress Notes (Unsigned)
 Referring Physician:  Montey Lot, PA-C [address] [place],  [place] [address]  Primary Physician:  Montey Lot, PA-C  History of Present Illness: [name] has a history of HTN, OSA, hypothyroidism, GAD, hyperlipidemia.   She has inspire implant for OSA.   History of lumbar surgery in 2021 and pain improved. She has been seeing [facility].   Last seen by me on 07/26/23 for 5 year history of right sided LBP with intermittent right leg pain with weakness. Not sure how long the right leg has been week.   She has known diffuse lumbar spondylosis and DDD. She had right foot drop on exam.   Lumbar MRI was ordered and she is here to review it. She was sent to [facility] for AFO for right foot.   She has appointment with [facility] on 08/30/23 for AFO. She is about the same.   She continues with constant right lower back pain with intermittent right posterior/lateral leg pain to her foot. Pain is worse at night- only had a few hours of sleep last night. She has numbness, tingling, and weakness in right leg along with cramping. No left leg pain.   She thinks she's had weakness in her right foot for over a year.   She is taking neurontin. No relief with last ESI.   She does not smoke.   Bowel/Bladder Dysfunction: urinary incontinence x 1 year-sees gynecologist and is taking Gemtesa  Conservative measures:  Physical therapy:  has not participated in PT recently Multimodal medical therapy including regular antiinflammatories:  Robaxin , Meloxicam, Tylenol  Arthritis, Hyland's cramp medication, Gabapentin, Tizanidine, voltaren gel, Lidocaine  patches Injections:  05/27/2023 right L5-S1 TF and right S1 TF ESI   Past Surgery:  history of cervical spine surgery in the early 2002, where a plate and screws were placed at C6 or C7  Lumbar Laminectomy L4 and L5-2021  The symptoms are causing a significant impact on the patient's life.   Review of Systems:  A 10  point review of systems is negative, except for the pertinent positives and negatives detailed in the HPI.  Past Medical History: Past Medical History:  Diagnosis Date   Anxiety    Arthritis    right shoulder   Cancer (HCC)    skin   GERD (gastroesophageal reflux disease)    Hyperlipidemia    Hypertension    Hypothyroidism    Motion sickness    PONV (postoperative nausea and vomiting)    Pre-diabetes    Sciatica of right side    Sleep apnea    Spinal stenosis of lumbar region    Thyroid  disease    under active thyroid  problems   Vitamin D  deficiency     Past Surgical History: Past Surgical History:  Procedure Laterality Date   CATARACT EXTRACTION W/PHACO Left 06/30/2021   Procedure: CATARACT EXTRACTION PHACO AND INTRAOCULAR LENS PLACEMENT (IOC) LEFT 6.29 00:38.4;  Surgeon: Jaye Fallow, MD;  Location: [facility];  Service: Ophthalmology;  Laterality: Left;  mid morning or late arrival   CATARACT EXTRACTION W/PHACO Right 12/01/2021   Procedure: CATARACT EXTRACTION PHACO AND INTRAOCULAR LENS PLACEMENT (IOC) RIGHT  5.25  00:34.2;  Surgeon: Jaye Fallow, MD;  [facility];  Service: Ophthalmology;  Laterality: Right;   DRUG INDUCED ENDOSCOPY Bilateral 06/15/2022   Procedure: DRUG INDUCED SLEEP  ENDOSCOPY;  Surgeon: Carlie Clark, MD;  Location: [facility];  Service: ENT;  Laterality: Bilateral;   IMPLANTATION OF HYPOGLOSSAL NERVE STIMULATOR  Right 08/02/2022   Procedure: IMPLANTATION OF HYPOGLOSSAL NERVE STIMULATOR;  Surgeon: Carlie Clark, MD;  Location: [facility];  Service: ENT;  Laterality: Right;   LUMBAR LAMINECTOMY  2021   [facility]   SPINE SURGERY  01/12/2003   cervical c-7   TUBAL LIGATION  01/12/1979    Allergies: Allergies as of 08/09/2023 - Review Complete 07/26/2023  Allergen Reaction Noted   Hydrocodone Nausea And Vomiting 11/03/2021   Oxycodone  Nausea And Vomiting 06/23/2021    Pantoprazole  11/25/2021   Tizanidine  11/25/2021   Wellbutrin [bupropion] Swelling 06/23/2021    Medications: Outpatient Encounter Medications as of 08/09/2023  Medication Sig   ACCU-CHEK GUIDE test strip USE TO TEST 1 (ONE) EACH FOR ONCE DAILY GLUCOSE MONITORING   Accu-Chek Softclix Lancets lancets SMARTSIG:1 Each Topical As Directed   ALPRAZolam  (XANAX ) 0.25 MG tablet Take 1 tablet by mouth once daily as needed for panic attacks (Patient taking differently: Take 0.25 mg by mouth 2 (two) times daily.)   aspirin 81 MG chewable tablet Chew 81 mg by mouth daily.   Coenzyme Q10 (COQ10) 200 MG CAPS Take 200 mg by mouth daily.   diclofenac Sodium (VOLTAREN) 1 % GEL Apply 1 Application topically 4 (four) times daily as needed (pain).   gabapentin (NEURONTIN) 100 MG capsule Take 100 mg by mouth 3 (three) times daily.   Homeopathic Products (LEG CRAMP RELIEF PO) Take 2 tablets by mouth 2 (two) times daily as needed (leg cramp).   Ibuprofen-diphenhydrAMINE  Cit (ADVIL PM PO) Take 2 tablets by mouth at bedtime as needed.   levothyroxine  (SYNTHROID ) 75 MCG tablet Take 75 mcg by mouth daily.   omeprazole  (PRILOSEC) 40 MG capsule Take 40 mg by mouth daily.   rosuvastatin  (CRESTOR ) 10 MG tablet Take 10 mg by mouth daily.   valsartan (DIOVAN) 320 MG tablet Take 320 mg by mouth daily.   No facility-administered encounter medications on file as of 08/09/2023.    Social History: Social History   Tobacco Use   Smoking status: Former    Current packs/day: 0.00    Average packs/day: 1 pack/day for 15.0 years (15.0 ttl pk-yrs)    Types: Cigarettes    Start date: 01/12/1988    Quit date: 01/12/2003    Years since quitting: 20.5   Smokeless tobacco: Never  Vaping Use   Vaping status: Never Used  Substance Use Topics   Alcohol use: Not Currently   Drug use: No    Family Medical History: Family History  Problem Relation Age of Onset   Stroke Mother    Hypertension Mother    COPD Mother     Hyperlipidemia Father    Heart disease Father    Hypertension Father    Cancer Maternal Grandmother    Heart disease Maternal Grandfather    Heart disease Paternal Grandmother    Birth defects Daughter    Diabetes Daughter    Hearing loss Daughter    Learning disabilities Daughter    Vision loss Daughter    Breast cancer Neg Hx     Physical Examination: There were no vitals filed for this visit.    Awake, alert, oriented to person, place, and time.  Speech is clear and fluent. Fund of knowledge is appropriate.   Cranial Nerves: Pupils equal round and reactive to light.  Facial tone is symmetric.    No gross lumbar tenderness.   No abnormal lesions on exposed skin.   Strength: Side Iliopsoas Quads Hamstring PF DF  EHL  R 5 5 5  4+ 3 3  L 5 5 5 5 5 5    Reflexes are 2+ and symmetric at the biceps, brachioradialis, patella and achilles, but depressed at right achilles.   Hoffman's is absent.  Clonus is not present.   Bilateral upper and lower extremity sensation is intact to light touch.     She can heel and toe stand on left leg. She cannot toe stand on right leg and has difficulty heel standing on right as well.   Slow gait.   Medical Decision Making  Imaging: Lumbar MRI dated 08/01/23:  FINDINGS: Segmentation:  Standard.   Alignment: Lumbar lordosis is maintained. No significant listhesis. Mild S-shaped scoliotic curvature of the lumbar spine with rightward convex curvature centered around L2 and leftward convex curvature centered on L4.   Vertebrae: Vertebral body heights are maintained. No bone marrow edema or evidence of acute fracture. Modic type 2 degenerative endplate changes and mild endplate irregularity at L4-5. Heterogeneous bone marrow signal intensity without suspicious osseous lesion.   Conus medullaris and cauda equina: Conus extends to the L1 level. Conus and cauda equina appear normal.   Paraspinal and other soft tissues: Postsurgical changes in  the paraspinal soft tissues at L4-5. The paraspinal soft tissues are otherwise unremarkable.   Disc levels:   T12-L1: Small disc bulge. Mild facet arthrosis. No significant spinal canal or foraminal stenosis.   L1-2: Small disc bulge. Mild facet arthrosis. No significant spinal canal or foraminal stenosis.   L2-3: Small disc bulge. Mild facet arthrosis. No significant spinal canal or foraminal stenosis.   L3-4: Diffuse disc bulge resulting in lateral recess narrowing greater on the right. Moderate facet arthrosis and thickening of the ligamentum flavum. No significant spinal canal stenosis. Possible impingement upon the traversing right L4 nerve root. There is moderate foraminal stenosis on the right.   L4-5: Disc desiccation and moderate disc height loss. Diffuse disc bulge and posterior osteophytes. Small left foraminal disc protrusion. Moderate facet arthrosis. Postsurgical changes of right hemilaminotomy. Mild spinal canal stenosis. There is moderate right and mild left foraminal stenosis. Extraforaminal component of disc bulge possibly contacts the right L4 nerve root.   L5-S1: Mild disc height loss. Diffuse disc bulge with mild lateral recess narrowing. Moderate facet arthrosis. No significant spinal canal stenosis. There is mild bilateral foraminal stenosis.   IMPRESSION: Degenerative changes as above. No high-grade spinal canal stenosis. Disc bulge at L3-4 results in lateral recess narrowing with possible impingement upon the traversing right L4 nerve root.   Extraforaminal component of disc bulge at L4-5 possibly contacts the right L4 nerve root.   Multilevel foraminal stenosis, greatest and moderate on the right at L3-4 and L4-5.     Electronically Signed   By: Donnice Mania M.D.   On: 08/05/2023 21:49   I have personally reviewed the images and agree with the above interpretation.   Assessment and Plan: Ms. Pursifull has a history of lumbar surgery in 2021.    She continues with constant right lower back pain with intermittent right posterior/lateral leg pain to her foot. She has numbness, tingling, and weakness in right leg along with cramping. No left leg pain.   She has known diffuse lumbar spondylosis and DDD. She has right lateral recess narrowing L3-L4 that impinges on right L4 nerve. She has moderate right foraminal stenosis L4-L5 with right foraminal disc that likely contacts right L4 nerve.   Right leg pain is likely from L4. Don't see good cause  of right foot weakness. She thinks she's had weakness in her right foot for over a year.    Treatment options discussed with patient and following plan made:   - She will get AFO as discussed. Has appointment in August.  - Referral to Dr. Avanell for consultation. No relief with previous lumbar ESI on right at L5-S1 and S1 TF. May consider L4 TF ESI.  - Recommend PT for lumbar spine. She declines. Made her worse previously.  - Recommend EMG/NCS to further evaluate right foot weakness. Orders to [facility].  - Will likely do MyChart/phone visit to review her EMG results. Follow up with me in 8 weeks and prn.   BP was elevated. No symptoms of chest pain, shortness of breath, blurry vision, or headaches. She checks BP at home and it generally runs better. Will recheck at home and call PCP if not improved. If she develops CP, SOB, blurry vision, or headaches, then she will go to ED.     I spent a total of 30 minutes in face-to-face and non-face-to-face activities related to this patient's care today including review of outside records, review of imaging, review of symptoms, physical exam, discussion of differential diagnosis, discussion of treatment options, and documentation.   Thank you for involving me in the care of this patient.   Glade Boys PA-C Dept. of Neurosurgery

## 2023-08-09 ENCOUNTER — Encounter: Payer: Self-pay | Admitting: Orthopedic Surgery

## 2023-08-09 ENCOUNTER — Ambulatory Visit (INDEPENDENT_AMBULATORY_CARE_PROVIDER_SITE_OTHER): Admitting: Orthopedic Surgery

## 2023-08-09 VITALS — BP 156/102 | Ht 67.0 in | Wt 174.2 lb

## 2023-08-09 DIAGNOSIS — R29898 Other symptoms and signs involving the musculoskeletal system: Secondary | ICD-10-CM

## 2023-08-09 DIAGNOSIS — Z9889 Other specified postprocedural states: Secondary | ICD-10-CM

## 2023-08-09 DIAGNOSIS — M47816 Spondylosis without myelopathy or radiculopathy, lumbar region: Secondary | ICD-10-CM

## 2023-08-09 DIAGNOSIS — M4726 Other spondylosis with radiculopathy, lumbar region: Secondary | ICD-10-CM

## 2023-08-09 DIAGNOSIS — M5416 Radiculopathy, lumbar region: Secondary | ICD-10-CM

## 2023-08-09 NOTE — Patient Instructions (Signed)
 It was so nice to see you today. Thank you so much for coming in.    You have some wear and tear in your back. I think your right leg pain is coming from the L4 nerve. Not sure what is causing weakness in your right foot.   I want you to see physical medicine and rehab at the [facility] to discuss another possible injection in your lower back. They should call you to schedule an appointment or you can call them at [phone].    I want to get an EMG (nerve conduction test) to look into things further. I have ordered this and [facility] will call you to schedule. You can also call them at [phone].   Your blood pressure was elevated today. I want you to recheck it at home and follow up with your PCP if it remains high. If you have any chest pain, shortness of breath, blurry vision, or headaches then you need to go to ED.    I will see you back in 6-8 weeks. Please do not hesitate to call if you have any questions or concerns. You can also message me in MyChart.   Glade Boys PA-C [phone]     The physicians and staff at [facility] Neurosurgery at [facility] are committed to providing excellent care. You may receive a survey asking for feedback about your experience at our office. We value you your feedback and appreciate you taking the time to to fill it out. The [facility] leadership team is also available to discuss your experience in person, feel free to contact us  [phone].

## 2023-08-25 ENCOUNTER — Encounter: Payer: Self-pay | Admitting: Neurology

## 2023-10-04 ENCOUNTER — Ambulatory Visit: Admitting: Orthopedic Surgery

## 2023-10-17 ENCOUNTER — Other Ambulatory Visit: Payer: Self-pay

## 2023-10-17 ENCOUNTER — Ambulatory Visit (INDEPENDENT_AMBULATORY_CARE_PROVIDER_SITE_OTHER): Admitting: Neurology

## 2023-10-17 DIAGNOSIS — R202 Paresthesia of skin: Secondary | ICD-10-CM

## 2023-10-17 DIAGNOSIS — M5417 Radiculopathy, lumbosacral region: Secondary | ICD-10-CM

## 2023-10-17 NOTE — Procedures (Signed)
 [facility] Neurology  [address], [address]  [place], [place] [address] Tel: [phone] Fax: [phone] Test Date:  10/17/2023  Patient: Darlene Mcdowell DOB: [birth year] Physician: Venetia Potters, MD  Sex: Female Height: 5' 7 Ref Phys: Glade Boys, DEVONNA  ID#: [id number]   Technician:    History: This is a 73 year old female with right leg pain and weakness.  NCV & EMG Findings: Extensive electrodiagnostic evaluation of bilateral lower limbs shows: Bilateral sural and superficial peroneal/fibular sensory responses are within normal limits. Bilateral peroneal/fibular (EDB) and tibial (AH) motor responses are within normal limits. Right H reflex is absent. Left H reflex latency is within normal limits. Chronic motor axon loss changes without accompanying active denervation changes are seen in right medial head of gastrocnemius, short head of biceps femoris, and gluteus maximus muscles. Lumbosacral paraspinal muscles were not tested due to prior history of lumbar spine surgery.  Impression: This is an abnormal study. The findings are most consistent with the following: The residuals of an old intraspinal canal lesion (ie: motor radiculopathy) at the right S1 root or segment, mild in degree electrically. No electrodiagnostic evidence of a left lumbosacral (L3-S1) motor radiculopathy. No electrodiagnostic evidence of a large fiber sensorimotor neuropathy.    ___________________________ Venetia Potters, MD    Nerve Conduction Studies Motor Nerve Results    Latency Amplitude F-Lat Segment Distance CV Comment  Site (ms) Norm (mV) Norm (ms)  (cm) (m/s) Norm   Left Fibular (EDB) Motor  Ankle 3.5  < 6.0 5.0  > 2.5        Bel fib head 10.2 - 4.6 -  Bel fib head-Ankle 30 45  > 40   Pop fossa 12.4 - 4.4 -  Pop fossa-Bel fib head 10 45 -   Right Fibular (EDB) Motor  Ankle 3.3  < 6.0 3.0  > 2.5        Bel fib head 10.6 - 2.3 -  Bel fib head-Ankle 31.5 43  > 40   Pop fossa 12.7 - 2.3 -   Pop fossa-Bel fib head 9.5 45 -   Left Tibial (AH) Motor  Ankle 4.3  < 6.0 14.9  > 4.0        Knee 12.9 - 8.8 -  Knee-Ankle 40 47  > 40   Right Tibial (AH) Motor  Ankle 4.1  < 6.0 18.5  > 4.0        Knee 13.4 - 13.2 -  Knee-Ankle 42 45  > 40    Sensory Sites    Neg Peak Lat Amplitude (O-P) Segment Distance Velocity Comment  Site (ms) Norm (V) Norm  (cm) (ms)   Left Superficial Fibular Sensory  14 cm-Ankle 3.1  < 4.6 4  > 3 14 cm-Ankle 14    Right Superficial Fibular Sensory  14 cm-Ankle 2.6  < 4.6 6  > 3 14 cm-Ankle 14    Left Sural Sensory  Calf-Lat mall 4.1  < 4.6 6  > 3 Calf-Lat mall 14    Right Sural Sensory  Calf-Lat mall 3.7  < 4.6 7  > 3 Calf-Lat mall 14     H-Reflex Results    M-Lat H Lat H Neg Amp H-M Lat  Site (ms) (ms) Norm (mV) (ms)  Left Tibial H-Reflex  Pop fossa 5.8 33.5  < 35.0 1.67 27.7  Right Tibial H-Reflex  Pop fossa 5.4 --  < 35.0 -- --   Electromyography   Side Muscle Ins.Act Fibs Fasc  Recrt Amp Dur Poly Activation Comment  Left Tib ant Nml Nml Nml Nml Nml Nml Nml Nml N/A  Left Gastroc MH Nml Nml Nml Nml Nml Nml Nml Nml N/A  Left Rectus fem Nml Nml Nml Nml Nml Nml Nml Nml N/A  Left Biceps fem SH Nml Nml Nml Nml Nml Nml Nml Nml N/A  Left Gluteus med Nml Nml Nml Nml Nml Nml Nml Nml N/A  Right Tib ant Nml Nml Nml Nml Nml Nml Nml Nml N/A  Right Gastroc MH Nml Nml Nml *1- *1+ *1+ *1+ Nml N/A  Right FDL Nml Nml Nml Nml Nml Nml Nml Nml N/A  Right Rectus fem Nml Nml Nml Nml Nml Nml Nml Nml N/A  Right Add longus Nml Nml Nml Nml Nml Nml Nml Nml N/A  Right Biceps fem SH Nml Nml Nml *1- *1+ *1+ *1+ Nml N/A  Right Gluteus med Nml Nml Nml Nml Nml Nml Nml Nml N/A  Right Gluteus max Nml Nml Nml *1- *1+ *1+ *1+ Nml N/A      Waveforms:  Motor           Sensory           H-Reflex

## 2023-10-18 ENCOUNTER — Telehealth: Payer: Self-pay | Admitting: Orthopedic Surgery

## 2023-10-18 NOTE — Telephone Encounter (Signed)
 EMG of bilateral lower extremities dated 10/17/23:  Impression: This is an abnormal study. The findings are most consistent with the following: The residuals of an old intraspinal canal lesion (ie: motor radiculopathy) at the right S1 root or segment, mild in degree electrically. No electrodiagnostic evidence of a left lumbosacral (L3-S1) motor radiculopathy. No electrodiagnostic evidence of a large fiber sensorimotor neuropathy.       ___________________________ Venetia Potters, MD  Reviewed with Dr. Claudene and called and briefly reviewed with patient. Will have her see Dr. Claudene to discuss further options. Cancelled her visit with me on 10/14 and she will see him on 10/20.

## 2023-10-19 NOTE — Progress Notes (Signed)
 Referring Physician:  Montey Lot, PA-C [address] [place],  [place] [address]  Primary Physician:  Montey Lot, PA-C  History of Present Illness: [name] has a history of HTN, OSA, hypothyroidism, GAD, hyperlipidemia.  She has never had a previous L5-S1 decompression where she had improved pain after decompression.  She was recently seen by Glade Boys and evaluated for possible foot drop with severe lower extremity radiculopathy and weakness.  She states that over the past 5 years her right sided low back pain has continued to get worse.  She feels worsening weakness in her right lower extremity.  She is getting numbness and tingling mostly on the lateral aspect of her lower extremity wrapping around to the top of the foot and lateral side of her foot.  She does not have any medial lower extremity pain numbness or tingling.  She has noticed that she has worsened weakness in her right lower extremity causing significant foot drop and she has also noticed her left leg will sometimes drag.  This tends to get worse when she is been trying to ambulate more.  She is taking neurontin. No relief with last ESI.   She does not smoke.   Bowel/Bladder Dysfunction: urinary incontinence x 1 year-sees gynecologist and is taking Gemtesa  Conservative measures:  Physical therapy:  has not participated in PT recently Multimodal medical therapy including regular antiinflammatories:  Robaxin , Meloxicam, Tylenol  Arthritis, Hyland's cramp medication, Gabapentin, Tizanidine, voltaren gel, Lidocaine  patches Injections:  05/27/2023 right L5-S1 TF and right S1 TF ESI   Past Surgery:  history of cervical spine surgery in the early 2002, where a plate and screws were placed at C6 or C7  Lumbar Laminectomy L4 and L5-2021  The symptoms are causing a significant impact on the patient's life.   Review of Systems:  A 10 point review of systems is negative, except for the pertinent positives and  negatives detailed in the HPI.  Past Medical History: Past Medical History:  Diagnosis Date   Anxiety    Arthritis    right shoulder   Cancer (HCC)    skin   GERD (gastroesophageal reflux disease)    Hyperlipidemia    Hypertension    Hypothyroidism    Motion sickness    PONV (postoperative nausea and vomiting)    Pre-diabetes    Sciatica of right side    Sleep apnea    Spinal stenosis of lumbar region    Thyroid  disease    under active thyroid  problems   Vitamin D  deficiency     Past Surgical History: Past Surgical History:  Procedure Laterality Date   CATARACT EXTRACTION W/PHACO Left 06/30/2021   Procedure: CATARACT EXTRACTION PHACO AND INTRAOCULAR LENS PLACEMENT (IOC) LEFT 6.29 00:38.4;  Surgeon: Jaye Fallow, MD;  Location: [facility];  Service: Ophthalmology;  Laterality: Left;  mid morning or late arrival   CATARACT EXTRACTION W/PHACO Right 12/01/2021   Procedure: CATARACT EXTRACTION PHACO AND INTRAOCULAR LENS PLACEMENT (IOC) RIGHT  5.25  00:34.2;  Surgeon: Jaye Fallow, MD;  Location: [facility];  Service: Ophthalmology;  Laterality: Right;   DRUG INDUCED ENDOSCOPY Bilateral 06/15/2022   Procedure: DRUG INDUCED SLEEP  ENDOSCOPY;  Surgeon: Carlie Clark, MD;  Location: [facility];  Service: ENT;  Laterality: Bilateral;   IMPLANTATION OF HYPOGLOSSAL NERVE STIMULATOR Right 08/02/2022   Procedure: IMPLANTATION OF HYPOGLOSSAL NERVE STIMULATOR;  Surgeon: Carlie Clark, MD;  Location:  SURGERY CENTER;  Service: ENT;  Laterality: Right;  LUMBAR LAMINECTOMY  2021   [facility]   SPINE SURGERY  01/12/2003   cervical c-7   TUBAL LIGATION  01/12/1979    Allergies: Allergies as of 08/09/2023 - Review Complete 07/26/2023  Allergen Reaction Noted   Hydrocodone Nausea And Vomiting 11/03/2021   Oxycodone  Nausea And Vomiting 06/23/2021   Pantoprazole  11/25/2021   Tizanidine  11/25/2021   Wellbutrin [bupropion]  Swelling 06/23/2021    Medications: Outpatient Encounter Medications as of 08/09/2023  Medication Sig   ACCU-CHEK GUIDE test strip USE TO TEST 1 (ONE) EACH FOR ONCE DAILY GLUCOSE MONITORING   Accu-Chek Softclix Lancets lancets SMARTSIG:1 Each Topical As Directed   ALPRAZolam  (XANAX ) 0.25 MG tablet Take 1 tablet by mouth once daily as needed for panic attacks (Patient taking differently: Take 0.25 mg by mouth 2 (two) times daily.)   aspirin 81 MG chewable tablet Chew 81 mg by mouth daily.   Coenzyme Q10 (COQ10) 200 MG CAPS Take 200 mg by mouth daily.   diclofenac Sodium (VOLTAREN) 1 % GEL Apply 1 Application topically 4 (four) times daily as needed (pain).   gabapentin (NEURONTIN) 100 MG capsule Take 100 mg by mouth 3 (three) times daily.   Homeopathic Products (LEG CRAMP RELIEF PO) Take 2 tablets by mouth 2 (two) times daily as needed (leg cramp).   Ibuprofen-diphenhydrAMINE  Cit (ADVIL PM PO) Take 2 tablets by mouth at bedtime as needed.   levothyroxine  (SYNTHROID ) 75 MCG tablet Take 75 mcg by mouth daily.   omeprazole  (PRILOSEC) 40 MG capsule Take 40 mg by mouth daily.   rosuvastatin  (CRESTOR ) 10 MG tablet Take 10 mg by mouth daily.   valsartan (DIOVAN) 320 MG tablet Take 320 mg by mouth daily.   No facility-administered encounter medications on file as of 08/09/2023.    Social History: Social History   Tobacco Use   Smoking status: Former    Current packs/day: 0.00    Average packs/day: 1 pack/day for 15.0 years (15.0 ttl pk-yrs)    Types: Cigarettes    Start date: 01/12/1988    Quit date: 01/12/2003    Years since quitting: 20.5   Smokeless tobacco: Never  Vaping Use   Vaping status: Never Used  Substance Use Topics   Alcohol use: Not Currently   Drug use: No    Family Medical History: Family History  Problem Relation Age of Onset   Stroke Mother    Hypertension Mother    COPD Mother    Hyperlipidemia Father    Heart disease Father    Hypertension Father    Cancer  Maternal Grandmother    Heart disease Maternal Grandfather    Heart disease Paternal Grandmother    Birth defects Daughter    Diabetes Daughter    Hearing loss Daughter    Learning disabilities Daughter    Vision loss Daughter    Breast cancer Neg Hx     Physical Examination: There were no vitals filed for this visit.    Awake, alert, oriented to person, place, and time.  Speech is clear and fluent. Fund of knowledge is appropriate.   Cranial Nerves: Pupils equal round and reactive to light.  Facial tone is symmetric.    No gross lumbar tenderness.   No abnormal lesions on exposed skin.   Strength: Side Iliopsoas Quads Hamstring PF DF EHL  R 5 5 5  4+ 3 3  L 5 5 5 5 5 5    Reflexes are 2+ and symmetric at the biceps, brachioradialis, patella  and achilles, but depressed at right achilles.  Medial hamstring reflex on the right is also decreased on compared to the left.  Hoffman's is absent.  Clonus is not present.   Bilateral upper and lower extremity sensation is intact to light touch, with the exception of decreased sensation in the right L5 distribution  Medical Decision Making  Imaging: Lumbar MRI dated 08/01/23:  FINDINGS: Segmentation:  Standard.   Alignment: Lumbar lordosis is maintained. No significant listhesis. Mild S-shaped scoliotic curvature of the lumbar spine with rightward convex curvature centered around L2 and leftward convex curvature centered on L4.   Vertebrae: Vertebral body heights are maintained. No bone marrow edema or evidence of acute fracture. Modic type 2 degenerative endplate changes and mild endplate irregularity at L4-5. Heterogeneous bone marrow signal intensity without suspicious osseous lesion.   Conus medullaris and cauda equina: Conus extends to the L1 level. Conus and cauda equina appear normal.   Paraspinal and other soft tissues: Postsurgical changes in the paraspinal soft tissues at L4-5. The paraspinal soft tissues  are otherwise unremarkable.   Disc levels:   T12-L1: Small disc bulge. Mild facet arthrosis. No significant spinal canal or foraminal stenosis.   L1-2: Small disc bulge. Mild facet arthrosis. No significant spinal canal or foraminal stenosis.   L2-3: Small disc bulge. Mild facet arthrosis. No significant spinal canal or foraminal stenosis.   L3-4: Diffuse disc bulge resulting in lateral recess narrowing greater on the right. Moderate facet arthrosis and thickening of the ligamentum flavum. No significant spinal canal stenosis. Possible impingement upon the traversing right L4 nerve root. There is moderate foraminal stenosis on the right.   L4-5: Disc desiccation and moderate disc height loss. Diffuse disc bulge and posterior osteophytes. Small left foraminal disc protrusion. Moderate facet arthrosis. Postsurgical changes of right hemilaminotomy. Mild spinal canal stenosis. There is moderate right and mild left foraminal stenosis. Extraforaminal component of disc bulge possibly contacts the right L4 nerve root.   L5-S1: Mild disc height loss. Diffuse disc bulge with mild lateral recess narrowing. Moderate facet arthrosis. No significant spinal canal stenosis. There is mild bilateral foraminal stenosis.   IMPRESSION: Degenerative changes as above. No high-grade spinal canal stenosis. Disc bulge at L3-4 results in lateral recess narrowing with possible impingement upon the traversing right L4 nerve root.   Extraforaminal component of disc bulge at L4-5 possibly contacts the right L4 nerve root.   Multilevel foraminal stenosis, greatest and moderate on the right at L3-4 and L4-5.     Electronically Signed   By: Donnice Mania M.D.   On: 08/05/2023 21:49   I have personally reviewed the images and agree with the above interpretation.   Assessment and Plan: Ms. Crumrine has a history of lumbar surgery in 2021.  She had a initially had a good result unfortunately she  continues to have worsened right lower extremity pain weakness numbness and tingling.  She feels like this is getting progressively worse.  She is having difficulty sleeping.  She feels like she has had progressive weakness as well.  When compared to her last evaluation she has significant foot drop with EHL weakness and also with weakness in inversion and eversion consistent with a lumbar radiculopathy.  Her MRI demonstrates L3-4 and L4-5 disc herniations with moderate stenosis noted.  She does not appear to be symptomatic from the L3-4 level but she does appear to be symptomatic from the L4-5 level.  She has what appears to be an extruded disc fragment versus a  large swollen intra dural nerve root, I think this is more likely to be a disc herniation this been slightly extruded causing her recurrent/worsening pain.She has positive straight leg raise, decreased L5 reflex, weak dorsiflexion EHL inversion and eversion.  She also has decreased sensation in the L5 distribution and straight leg raise reproduces her right sided L5 symptoms.    Given her progressive weakness numbness tingling refractory pain and it has impact on her quality of life and ability to perform her ADLs would plan for a right sided L4-5 hemilaminectomy discectomy.  We discussed that she may not have a complete resolution of her pain, CSF leak, nerve injury, spine injury, need for further surgery, further decompression, possible fusion.  Penne MICAEL Sharps, MD Dept. of Neurosurgery

## 2023-10-24 ENCOUNTER — Ambulatory Visit: Admitting: Orthopedic Surgery

## 2023-10-25 ENCOUNTER — Ambulatory Visit: Admitting: Orthopedic Surgery

## 2023-10-31 ENCOUNTER — Other Ambulatory Visit: Payer: Self-pay

## 2023-10-31 ENCOUNTER — Ambulatory Visit: Payer: Self-pay | Admitting: Neurosurgery

## 2023-10-31 ENCOUNTER — Ambulatory Visit (INDEPENDENT_AMBULATORY_CARE_PROVIDER_SITE_OTHER): Admitting: Neurosurgery

## 2023-10-31 VITALS — BP 136/88 | Ht 67.0 in | Wt 169.0 lb

## 2023-10-31 DIAGNOSIS — M5126 Other intervertebral disc displacement, lumbar region: Secondary | ICD-10-CM

## 2023-10-31 DIAGNOSIS — R29898 Other symptoms and signs involving the musculoskeletal system: Secondary | ICD-10-CM

## 2023-10-31 DIAGNOSIS — Z01818 Encounter for other preprocedural examination: Secondary | ICD-10-CM

## 2023-10-31 DIAGNOSIS — M21371 Foot drop, right foot: Secondary | ICD-10-CM | POA: Diagnosis not present

## 2023-10-31 DIAGNOSIS — M5416 Radiculopathy, lumbar region: Secondary | ICD-10-CM

## 2023-10-31 DIAGNOSIS — M5106 Intervertebral disc disorders with myelopathy, lumbar region: Secondary | ICD-10-CM | POA: Insufficient documentation

## 2023-10-31 DIAGNOSIS — M47816 Spondylosis without myelopathy or radiculopathy, lumbar region: Secondary | ICD-10-CM

## 2023-10-31 DIAGNOSIS — M48061 Spinal stenosis, lumbar region without neurogenic claudication: Secondary | ICD-10-CM | POA: Diagnosis not present

## 2023-10-31 NOTE — Patient Instructions (Addendum)
 Please see below for information in regards to your upcoming surgery:   Planned surgery: Right L4-5 Hemilaminectomy and discectomy, MIS     Surgery date: 12/06/2023 at [facility] (Medical Mall: [address] [place], [place] [address]) - you will find out your arrival time the business day before your surgery.    Pre-op appointment at [facility] Pre-admit Testing: you will receive a call with a date/time for this appointment. If you are scheduled for an in person appointment, Pre-admit Testing is located on the first floor of the [facility] building, [address], [address]. During this appointment, they will advise you which medications you can take the morning of surgery, and which medications you will need to hold for surgery. Labs (such as blood work, EKG) may be done at your pre-op appointment. You are not required to fast for these labs. Should you need to change your pre-op appointment, please call Pre-admit testing at [phone].      Blood thinners:  Aspirin 81mg :   if taking as a preventative, stop aspirin 7 days prior, resume aspirin 14 days after      Surgical clearance: we will send a clearance form to Rankin Dike, PA. They may wish to see you in their office prior to signing the clearance form. If so, they may call you to schedule an appointment.     Common restrictions after spine surgery: No bending, lifting, or twisting ("BLT"). Avoid lifting objects heavier than 10 pounds for the first 6 weeks after surgery. Where possible, avoid household activities that involve lifting, bending, reaching, pushing, or pulling such as laundry, vacuuming, grocery shopping, and childcare. Try to arrange for help from friends and family for these activities while you heal. Do not drive while taking prescription pain medication. Weeks 6 through 12 after surgery: avoid lifting more than 25 pounds.      How to contact us :  If you have any  questions/concerns before or after surgery, you can reach us  at [phone], or you can send a mychart message. We can be reached by phone or mychart 8am-4pm, Monday-Friday.  *Please note: Calls after 4pm are forwarded to a third party answering service. Mychart messages are not routinely monitored during evenings, weekends, and holidays. Please call our office to contact the answering service for urgent concerns during non-business hours.      If you have FMLA/disability paperwork, please drop it off or fax it to [phone]   Appointments/FMLA & disability paperwork: Reche Hait, & Nichole Registered Nurse/Surgery scheduler: Kendelyn, RN & Katie, RN Certified Medical Assistants: Don, CMA, Elenor, CMA, Damien, CMA, & Auston, [place] Physician Assistants: Lyle Decamp, PA-C, Edsel Goods, PA-C & Glade Boys, PA-C Surgeons: Penne Sharps, MD & Reeves Daisy, MD     [facility] PREADMIT TESTING VISIT and SURGERY INFORMATION SHEET   Now that surgery has been scheduled you can anticipate several phone calls from [facility] services. A pharmacy technician will call you to verify your current list of medications taken at home.               The Pre-Service Center will call to verify your insurance information and to give you billing estimates and information.             The Preadmit Testing Office will be calling to schedule a visit to obtain information for the anesthesia team and provide instructions on preparation for surgery.  What can you expect for the Preadmit Testing  Visit: Appointments may be scheduled in-person or by telephone.  If a telephone visit is scheduled, you may be asked to come into the office to have lab tests or other studies performed.   This visit will not be completed any greater than 14 days prior to your surgery.  If your surgery has been scheduled for a future date, please do not be alarmed if we have not contacted you to schedule  an appointment more than a month prior to the surgery date.    Please be prepared to provide the following information during this appointment:            -Personal medical history                                               -Medication and allergy list            -Any history of problems with anesthesia              -Recent lab work or diagnostic studies            -Please notify us  of any needs we should be aware of to provide the best care possible           -You will be provided with instructions on how to prepare for your surgery.    On The Day of Surgery:  You must have a driver to take you home after surgery, you will be asked not to drive for 24 hours following surgery.  Taxi, Gisele and non-medical transport will not be acceptable means of transportation unless you have a responsible individual who will be traveling with you.  Visitors in the surgical area:   2 people will be able to visit you in your room once your preparation for surgery has been completed. During surgery, your visitors will be asked to wait in the Surgery Waiting Area.  It is not a requirement for them to stay, if they prefer to leave and come back.  Your visitor(s) will be given an update once the surgery has been completed.  No visitors are allowed in the initial recovery room to respect patient privacy and safety.  Once you are more awake and transfer to the secondary recovery area, or are transferred to an inpatient room, visitors will again be able to see you.  To respect and protect your privacy: We will ask on the day of surgery who your driver will be and what the contact number for that individual will be. We will ask if it is okay to share information with this individual, or if there is an alternative individual that we, or the surgeon, should contact to provide updates and information. If family or friends come to the surgical information desk requesting information about you, who you have not listed  with us , no information will be given.   It may be helpful to designate someone as the main contact who will be responsible for updating your other friends and family.    PREADMIT TESTING OFFICE: [phone] SAME DAY SURGERY: [phone] We look forward to caring for you before and throughout the process of your surgery.

## 2023-11-03 ENCOUNTER — Encounter: Payer: Self-pay | Admitting: Neurosurgery

## 2023-11-15 ENCOUNTER — Other Ambulatory Visit

## 2023-11-15 ENCOUNTER — Ambulatory Visit (INDEPENDENT_AMBULATORY_CARE_PROVIDER_SITE_OTHER)

## 2023-11-15 DIAGNOSIS — M4726 Other spondylosis with radiculopathy, lumbar region: Secondary | ICD-10-CM | POA: Diagnosis not present

## 2023-11-15 DIAGNOSIS — M47816 Spondylosis without myelopathy or radiculopathy, lumbar region: Secondary | ICD-10-CM

## 2023-11-15 DIAGNOSIS — M5416 Radiculopathy, lumbar region: Secondary | ICD-10-CM

## 2023-11-22 ENCOUNTER — Encounter
Admission: RE | Admit: 2023-11-22 | Discharge: 2023-11-22 | Disposition: A | Discharge status: Home / Self Care | Source: Ambulatory Visit | Attending: Neurosurgery | Admitting: Neurosurgery

## 2023-11-22 ENCOUNTER — Other Ambulatory Visit: Payer: Self-pay

## 2023-11-22 ENCOUNTER — Encounter: Payer: Self-pay | Admitting: Neurosurgery

## 2023-11-22 VITALS — BP 149/71 | HR 72 | Temp 97.9°F | Resp 20 | Ht 67.0 in | Wt 164.5 lb

## 2023-11-22 DIAGNOSIS — F411 Generalized anxiety disorder: Secondary | ICD-10-CM

## 2023-11-22 DIAGNOSIS — I10 Essential (primary) hypertension: Secondary | ICD-10-CM | POA: Insufficient documentation

## 2023-11-22 DIAGNOSIS — Z01818 Encounter for other preprocedural examination: Secondary | ICD-10-CM | POA: Insufficient documentation

## 2023-11-22 DIAGNOSIS — G4733 Obstructive sleep apnea (adult) (pediatric): Secondary | ICD-10-CM | POA: Diagnosis not present

## 2023-11-22 DIAGNOSIS — R7303 Prediabetes: Secondary | ICD-10-CM | POA: Diagnosis not present

## 2023-11-22 DIAGNOSIS — M47816 Spondylosis without myelopathy or radiculopathy, lumbar region: Secondary | ICD-10-CM | POA: Insufficient documentation

## 2023-11-22 DIAGNOSIS — M5416 Radiculopathy, lumbar region: Secondary | ICD-10-CM | POA: Diagnosis not present

## 2023-11-22 DIAGNOSIS — Z01812 Encounter for preprocedural laboratory examination: Secondary | ICD-10-CM

## 2023-11-22 LAB — BASIC METABOLIC PANEL WITH GFR
Anion gap: 11 (ref 5–15)
BUN: 15 mg/dL (ref 8–23)
CO2: 24 mmol/L (ref 22–32)
Calcium: 9.8 mg/dL (ref 8.9–10.3)
Chloride: 106 mmol/L (ref 98–111)
Creatinine, Ser: 0.81 mg/dL (ref 0.44–1.00)
GFR, Estimated: >60 mL/min (ref >60)
Glucose, Bld: 103 mg/dL — ABNORMAL HIGH (ref 70–99)
Potassium: 4.1 mmol/L (ref 3.5–5.1)
Sodium: 140 mmol/L (ref 135–145)

## 2023-11-22 LAB — URINALYSIS, COMPLETE (UACMP) WITH MICROSCOPIC
Bilirubin Urine: NEGATIVE
Glucose, UA: NEGATIVE mg/dL
Hgb urine dipstick: NEGATIVE
Ketones, ur: NEGATIVE mg/dL
Leukocytes,Ua: NEGATIVE
Nitrite: NEGATIVE
Protein, ur: NEGATIVE mg/dL
Specific Gravity, Urine: 1.032 — ABNORMAL HIGH (ref 1.005–1.030)
pH: 5 (ref 5.0–8.0)

## 2023-11-22 LAB — CBC
HCT: 38.7 % (ref 36.0–46.0)
Hemoglobin: 12.9 g/dL (ref 12.0–15.0)
MCH: 29.7 pg (ref 26.0–34.0)
MCHC: 33.3 g/dL (ref 30.0–36.0)
MCV: 89.2 fL (ref 80.0–100.0)
Platelets: 177 K/uL (ref 150–400)
RBC: 4.34 MIL/uL (ref 3.87–5.11)
RDW: 13.5 % (ref 11.5–15.5)
WBC: 7.3 K/uL (ref 4.0–10.5)
nRBC: 0 % (ref 0.0–0.2)

## 2023-11-22 LAB — SURGICAL PCR SCREEN
MRSA, PCR: NEGATIVE
Staphylococcus aureus: NEGATIVE

## 2023-11-22 LAB — TYPE AND SCREEN
ABO/RH(D): A POS
Antibody Screen: NEGATIVE

## 2023-11-22 NOTE — Patient Instructions (Addendum)
 Your procedure is scheduled on: 12/06/2023,Tuesday Report to the Registration Desk on the 1st floor of the [facility]. To find out your arrival time, please call [phone] between 1PM - 3PM on: Monday,11/24  If your arrival time is 6:00 am, do not arrive before that time as the [facility] entrance doors do not open until 6:00 am.  REMEMBER: Instructions that are not followed completely may result in serious medical risk, up to and including death; or upon the discretion of your surgeon and anesthesiologist your surgery may need to be rescheduled.  Do not eat food after midnight the night before surgery.  No gum chewing or hard candies.  You may however, drink CLEAR liquids up to 2 hours before you are scheduled to arrive for your surgery. Do not drink anything within 2 hours of your scheduled arrival time.  Clear liquids include: - water  - apple juice without pulp - gatorade (not RED colors) - black coffee or tea (Do NOT add milk or creamers to the coffee or tea) Do NOT drink anything that is not on this list.    One week prior to surgery: Stop Anti-inflammatories (NSAIDS) such as Advil, Aleve, Ibuprofen, Motrin, Naproxen, Naprosyn and Aspirin based products such as Excedrin, Goody's Powder, BC Powder. Stop ANY OVER THE COUNTER supplements until after surgery.  You may however, continue to take Tylenol  if needed for pain up until the day of surgery. Continue taking all of your other prescription medications up until the day of surgery.  ON THE DAY OF SURGERY ONLY TAKE THESE MEDICATIONS WITH SIPS OF WATER:  GEMTESA  omeprazole  (PRILOSEC) xanax  4.levothyroxine  (SYNTHROID )     No Alcohol for 24 hours before or after surgery.  No Smoking including e-cigarettes for 24 hours before surgery.  No chewable tobacco products for at least 6 hours before surgery.  No nicotine patches on the day of surgery.  Do not use any recreational drugs for at least a week (preferably  2 weeks) before your surgery.  Please be advised that the combination of cocaine and anesthesia may have negative outcomes, up to and including death. If you test positive for cocaine, your surgery will be cancelled.  On the morning of surgery brush your teeth with toothpaste and water, you may rinse your mouth with mouthwash if you wish. Do not swallow any toothpaste or mouthwash.  Use CHG Soap or wipes as directed on instruction sheet.-provided for you   Do not wear jewelry, make-up, hairpins, clips or nail polish.  Do not wear lotions, powders, or perfumes.   Do not shave body hair from the neck down 48 hours before surgery.  Contact lenses, hearing aids and dentures may not be worn into surgery.  Do not bring valuables to the hospital. [facility] is not responsible for any missing/lost belongings or valuables.     Bring your the remote control for your hypoglossal stimulator.  Notify your doctor if there is any change in your medical condition (cold, fever, infection).  Wear comfortable clothing (specific to your surgery type) to the hospital.  After surgery, you can help prevent lung complications by doing breathing exercises.  Take deep breaths and cough every 1-2 hours. Your doctor may order a device called an Incentive Spirometer to help you take deep breaths.  If you are being admitted to the hospital overnight, leave your suitcase in the car. After surgery it may be brought to your room.  In case of increased patient census, it may be  necessary for you, the patient, to continue your postoperative care in the Same Day Surgery department.  If you are being discharged the day of surgery, you will not be allowed to drive home. You will need a responsible individual to drive you home and stay with you for 24 hours after surgery.   If you are taking public transportation, you will need to have a responsible individual with you.  Please call the Pre-admissions Testing Dept.  at [phone] if you have any questions about these instructions.  Surgery Visitation Policy:  Patients having surgery or a procedure may have two visitors.  Children under the age of 3 must have an adult with them who is not the patient.  Inpatient Visitation:    Visiting hours are 7 a.m. to 8 p.m. Up to four visitors are allowed at one time in a patient room. The visitors may rotate out with other people during the day.  One visitor age 62 or older may stay with the patient overnight and must be in the room by 8 p.m.   Merchandiser, Retail to address health-related social needs:  [email or web address]      Pre-operative 4 CHG Bath Instructions   You can play a key role in reducing the risk of infection after surgery. Your skin needs to be as free of germs as possible. You can reduce the number of germs on your skin by washing with CHG (chlorhexidine  gluconate) soap before surgery. CHG is an antiseptic soap that kills germs and continues to kill germs even after washing.   DO NOT use if you have an allergy to chlorhexidine /CHG or antibacterial soaps. If your skin becomes reddened or irritated, stop using the CHG and notify one of our RNs at [phone].   Please shower with the CHG soap starting 4 days before surgery using the following schedule:         Please keep in mind the following:  DO NOT shave, including legs and underarms, starting the day of your first shower.   You may shave your face at any point before/day of surgery.  Place clean sheets on your bed the day you start using CHG soap. Use a clean washcloth (not used since being washed) for each shower. DO NOT sleep with pets once you start using the CHG.   CHG Shower Instructions:  If you choose to wash your hair and private area, wash first with your normal shampoo/soap.  After you use shampoo/soap, rinse your hair and body thoroughly to remove shampoo/soap residue.  Turn the water  OFF and apply about 3 tablespoons (45 ml) of CHG soap to a CLEAN washcloth.  Apply CHG soap ONLY FROM YOUR NECK DOWN TO YOUR TOES (washing for 3-5 minutes)  DO NOT use CHG soap on face, private areas, open wounds, or sores.  Pay special attention to the area where your surgery is being performed.  If you are having back surgery, having someone wash your back for you may be helpful. Wait 2 minutes after CHG soap is applied, then you may rinse off the CHG soap.  Pat dry with a clean towel  Put on clean clothes/pajamas   If you choose to wear lotion, please use ONLY the CHG-compatible lotions on the back of this paper.     Additional instructions for the day of surgery: DO NOT APPLY any lotions, deodorants, cologne, or perfumes.   Put on clean/comfortable clothes.  Brush your teeth.  Ask your nurse before applying any prescription  medications to the skin.      CHG Compatible Lotions   Aveeno Moisturizing lotion  Cetaphil Moisturizing Cream  Cetaphil Moisturizing Lotion  Clairol Herbal Essence Moisturizing Lotion, Dry Skin  Clairol Herbal Essence Moisturizing Lotion, Extra Dry Skin  Clairol Herbal Essence Moisturizing Lotion, Normal Skin  Curel Age Defying Therapeutic Moisturizing Lotion with Alpha Hydroxy  Curel Extreme Care Body Lotion  Curel Soothing Hands Moisturizing Hand Lotion  Curel Therapeutic Moisturizing Cream, Fragrance-Free  Curel Therapeutic Moisturizing Lotion, Fragrance-Free  Curel Therapeutic Moisturizing Lotion, Original Formula  Eucerin Daily Replenishing Lotion  Eucerin Dry Skin Therapy Plus Alpha Hydroxy Crme  Eucerin Dry Skin Therapy Plus Alpha Hydroxy Lotion  Eucerin Original Crme  Eucerin Original Lotion  Eucerin Plus Crme Eucerin Plus Lotion  Eucerin TriLipid Replenishing Lotion  Keri Anti-Bacterial Hand Lotion  Keri Deep Conditioning Original Lotion Dry Skin Formula Softly Scented  Keri Deep Conditioning Original Lotion, Fragrance Free Sensitive  Skin Formula  Keri Lotion Fast Absorbing Fragrance Free Sensitive Skin Formula  Keri Lotion Fast Absorbing Softly Scented Dry Skin Formula  Keri Original Lotion  Keri Skin Renewal Lotion Keri Silky Smooth Lotion  Keri Silky Smooth Sensitive Skin Lotion  Nivea Body Creamy Conditioning Oil  Nivea Body Extra Enriched Teacher, Adult Education Moisturizing Lotion Nivea Crme  Nivea Skin Firming Lotion  NutraDerm 30 Skin Lotion  NutraDerm Skin Lotion  NutraDerm Therapeutic Skin Cream  NutraDerm Therapeutic Skin Lotion  ProShield Protective Hand Cream  Provon moisturizing lotion

## 2023-12-06 ENCOUNTER — Ambulatory Visit: Admitting: General Practice

## 2023-12-06 ENCOUNTER — Ambulatory Visit
Admission: RE | Admit: 2023-12-06 | Discharge: 2023-12-06 | Disposition: A | Discharge status: Home / Self Care | Source: Ambulatory Visit | Attending: Neurosurgery | Admitting: Neurosurgery

## 2023-12-06 ENCOUNTER — Encounter: Payer: Self-pay | Admitting: Neurosurgery

## 2023-12-06 ENCOUNTER — Other Ambulatory Visit: Payer: Self-pay | Admitting: Neurosurgery

## 2023-12-06 ENCOUNTER — Other Ambulatory Visit: Payer: Self-pay

## 2023-12-06 ENCOUNTER — Telehealth: Payer: Self-pay

## 2023-12-06 ENCOUNTER — Encounter
Admission: RE | Disposition: A | Discharge status: Home / Self Care | Payer: Self-pay | Source: Ambulatory Visit | Attending: Neurosurgery

## 2023-12-06 ENCOUNTER — Ambulatory Visit

## 2023-12-06 DIAGNOSIS — Z79890 Hormone replacement therapy (postmenopausal): Secondary | ICD-10-CM | POA: Insufficient documentation

## 2023-12-06 DIAGNOSIS — G4733 Obstructive sleep apnea (adult) (pediatric): Secondary | ICD-10-CM | POA: Insufficient documentation

## 2023-12-06 DIAGNOSIS — Z79899 Other long term (current) drug therapy: Secondary | ICD-10-CM | POA: Insufficient documentation

## 2023-12-06 DIAGNOSIS — E039 Hypothyroidism, unspecified: Secondary | ICD-10-CM | POA: Diagnosis not present

## 2023-12-06 DIAGNOSIS — Z7982 Long term (current) use of aspirin: Secondary | ICD-10-CM | POA: Diagnosis not present

## 2023-12-06 DIAGNOSIS — M1990 Unspecified osteoarthritis, unspecified site: Secondary | ICD-10-CM | POA: Diagnosis not present

## 2023-12-06 DIAGNOSIS — I10 Essential (primary) hypertension: Secondary | ICD-10-CM | POA: Diagnosis not present

## 2023-12-06 DIAGNOSIS — Z01812 Encounter for preprocedural laboratory examination: Secondary | ICD-10-CM

## 2023-12-06 DIAGNOSIS — Z87891 Personal history of nicotine dependence: Secondary | ICD-10-CM | POA: Diagnosis not present

## 2023-12-06 DIAGNOSIS — Z01818 Encounter for other preprocedural examination: Secondary | ICD-10-CM

## 2023-12-06 DIAGNOSIS — R7303 Prediabetes: Secondary | ICD-10-CM

## 2023-12-06 DIAGNOSIS — M5116 Intervertebral disc disorders with radiculopathy, lumbar region: Secondary | ICD-10-CM | POA: Insufficient documentation

## 2023-12-06 DIAGNOSIS — M47816 Spondylosis without myelopathy or radiculopathy, lumbar region: Secondary | ICD-10-CM | POA: Diagnosis present

## 2023-12-06 DIAGNOSIS — E785 Hyperlipidemia, unspecified: Secondary | ICD-10-CM | POA: Insufficient documentation

## 2023-12-06 DIAGNOSIS — M5416 Radiculopathy, lumbar region: Secondary | ICD-10-CM | POA: Diagnosis present

## 2023-12-06 DIAGNOSIS — M21371 Foot drop, right foot: Secondary | ICD-10-CM | POA: Diagnosis not present

## 2023-12-06 DIAGNOSIS — R32 Unspecified urinary incontinence: Secondary | ICD-10-CM | POA: Insufficient documentation

## 2023-12-06 DIAGNOSIS — M48061 Spinal stenosis, lumbar region without neurogenic claudication: Secondary | ICD-10-CM | POA: Diagnosis not present

## 2023-12-06 DIAGNOSIS — R29898 Other symptoms and signs involving the musculoskeletal system: Secondary | ICD-10-CM | POA: Diagnosis present

## 2023-12-06 HISTORY — PX: LUMBAR LAMINECTOMY/DECOMPRESSION MICRODISCECTOMY: SHX5026

## 2023-12-06 SURGERY — LUMBAR LAMINECTOMY/DECOMPRESSION MICRODISCECTOMY 1 LEVEL
Anesthesia: General | Site: Spine Lumbar | Laterality: Right

## 2023-12-06 MED ORDER — ONDANSETRON HCL 4 MG/2ML IJ SOLN
INTRAMUSCULAR | Status: AC
  Filled 2023-12-06: qty 2

## 2023-12-06 MED ORDER — SENNA 8.6 MG PO TABS
1.0000 | ORAL_TABLET | Freq: Two times a day (BID) | ORAL | 0 refills | Status: DC | PRN
  Filled 2023-12-06: qty 30, 15d supply, fill #0

## 2023-12-06 MED ORDER — KETAMINE HCL 50 MG/5ML IJ SOSY
PREFILLED_SYRINGE | INTRAMUSCULAR | Status: DC | PRN
  Administered 2023-12-06: 30 mg via INTRAVENOUS
  Administered 2023-12-06 (×2): 10 mg via INTRAVENOUS

## 2023-12-06 MED ORDER — BUPIVACAINE-EPINEPHRINE (PF) 0.5% -1:200000 IJ SOLN
INTRAMUSCULAR | Status: DC | PRN
Start: 2023-12-06 — End: 2023-12-06
  Administered 2023-12-06: 10 mL

## 2023-12-06 MED ORDER — BUPIVACAINE HCL (PF) 0.5 % IJ SOLN
INTRAMUSCULAR | Status: AC
  Filled 2023-12-06: qty 60

## 2023-12-06 MED ORDER — BUPIVACAINE HCL 0.5 % IJ SOLN
INTRAMUSCULAR | Status: DC | PRN
  Administered 2023-12-06: 30 mL

## 2023-12-06 MED ORDER — METHOCARBAMOL 500 MG PO TABS
500.0000 mg | ORAL_TABLET | Freq: Four times a day (QID) | ORAL | 0 refills | Status: DC | PRN
  Filled 2023-12-06: qty 120, 30d supply, fill #0

## 2023-12-06 MED ORDER — ACETAMINOPHEN 10 MG/ML IV SOLN
INTRAVENOUS | Status: DC | PRN
  Administered 2023-12-06: 1000 mg via INTRAVENOUS

## 2023-12-06 MED ORDER — ONDANSETRON HCL 4 MG/2ML IJ SOLN
INTRAMUSCULAR | Status: DC | PRN
  Administered 2023-12-06 (×2): 4 mg via INTRAVENOUS

## 2023-12-06 MED ORDER — MIDAZOLAM HCL (PF) 2 MG/2ML IJ SOLN
INTRAMUSCULAR | Status: DC | PRN
  Administered 2023-12-06: 2 mg via INTRAVENOUS

## 2023-12-06 MED ORDER — DEXAMETHASONE SOD PHOSPHATE PF 10 MG/ML IJ SOLN
INTRAMUSCULAR | Status: DC | PRN
  Administered 2023-12-06: 10 mg via INTRAVENOUS

## 2023-12-06 MED ORDER — METHOCARBAMOL 500 MG PO TABS: 500.0000 mg | ORAL_TABLET | Freq: Four times a day (QID) | ORAL | 0 refills | Status: AC | PRN

## 2023-12-06 MED ORDER — SUGAMMADEX SODIUM 200 MG/2ML IV SOLN
INTRAVENOUS | Status: DC | PRN
  Administered 2023-12-06: 50 mg via INTRAVENOUS
  Administered 2023-12-06: 30 mg via INTRAVENOUS
  Administered 2023-12-06: 20 mg via INTRAVENOUS
  Administered 2023-12-06: 50 mg via INTRAVENOUS

## 2023-12-06 MED ORDER — PHENYLEPHRINE 80 MCG/ML (10ML) SYRINGE FOR IV PUSH (FOR BLOOD PRESSURE SUPPORT)
PREFILLED_SYRINGE | INTRAVENOUS | Status: DC | PRN
  Administered 2023-12-06: 160 ug via INTRAVENOUS

## 2023-12-06 MED ORDER — BUPIVACAINE LIPOSOME 1.3 % IJ SUSP
INTRAMUSCULAR | Status: AC
  Filled 2023-12-06: qty 30

## 2023-12-06 MED ORDER — FENTANYL CITRATE (PF) 100 MCG/2ML IJ SOLN
INTRAMUSCULAR | Status: AC
  Filled 2023-12-06: qty 2

## 2023-12-06 MED ORDER — PROPOFOL 10 MG/ML IV BOLUS
INTRAVENOUS | Status: AC
  Filled 2023-12-06: qty 20

## 2023-12-06 MED ORDER — METHYLPREDNISOLONE ACETATE 40 MG/ML IJ SUSP
INTRAMUSCULAR | Status: DC | PRN
  Administered 2023-12-06: 40 mg

## 2023-12-06 MED ORDER — CEFAZOLIN IN SODIUM CHLORIDE 2-0.9 GM/100ML-% IV SOLN
2.0000 g | Freq: Once | INTRAVENOUS | Status: AC
  Administered 2023-12-06: 2 g via INTRAVENOUS

## 2023-12-06 MED ORDER — POLYETHYLENE GLYCOL 3350 17 G PO PACK
17.0000 g | PACK | Freq: Every day | ORAL | 0 refills | Status: DC | PRN
  Filled 2023-12-06: qty 7, 7d supply, fill #0

## 2023-12-06 MED ORDER — FENTANYL CITRATE (PF) 100 MCG/2ML IJ SOLN
INTRAMUSCULAR | Status: DC | PRN
  Administered 2023-12-06 (×2): 25 ug via INTRAVENOUS
  Administered 2023-12-06: 50 ug via INTRAVENOUS

## 2023-12-06 MED ORDER — POLYETHYLENE GLYCOL 3350 17 G PO PACK: 17.0000 g | PACK | Freq: Every day | ORAL | 0 refills | Status: AC | PRN

## 2023-12-06 MED ORDER — METHYLPREDNISOLONE ACETATE 40 MG/ML IJ SUSP
INTRAMUSCULAR | Status: AC
  Filled 2023-12-06: qty 1

## 2023-12-06 MED ORDER — MIDAZOLAM HCL 2 MG/2ML IJ SOLN
INTRAMUSCULAR | Status: AC
  Filled 2023-12-06: qty 2

## 2023-12-06 MED ORDER — ROCURONIUM BROMIDE 100 MG/10ML IV SOLN
INTRAVENOUS | Status: DC | PRN
  Administered 2023-12-06: 10 mg via INTRAVENOUS
  Administered 2023-12-06: 60 mg via INTRAVENOUS

## 2023-12-06 MED ORDER — TRAMADOL HCL 50 MG PO TABS: 50.0000 mg | ORAL_TABLET | ORAL | 0 refills | Status: DC | PRN

## 2023-12-06 MED ORDER — TRAMADOL HCL 50 MG PO TABS
50.0000 mg | ORAL_TABLET | ORAL | 0 refills | Status: DC | PRN
  Filled 2023-12-06: qty 30, 5d supply, fill #0

## 2023-12-06 MED ORDER — CHLORHEXIDINE GLUCONATE 0.12 % MT SOLN
OROMUCOSAL | Status: AC
  Filled 2023-12-06: qty 15

## 2023-12-06 MED ORDER — GLYCOPYRROLATE 0.2 MG/ML IJ SOLN
INTRAMUSCULAR | Status: DC | PRN
  Administered 2023-12-06: .2 mg via INTRAVENOUS

## 2023-12-06 MED ORDER — FENTANYL CITRATE (PF) 100 MCG/2ML IJ SOLN: 25.0000 ug | INTRAMUSCULAR | Status: DC | PRN

## 2023-12-06 MED ORDER — CEFAZOLIN SODIUM-DEXTROSE 2-4 GM/100ML-% IV SOLN
INTRAVENOUS | Status: AC
  Filled 2023-12-06: qty 100

## 2023-12-06 MED ORDER — ORAL CARE MOUTH RINSE: 15.0000 mL | Freq: Once | OROMUCOSAL | Status: AC

## 2023-12-06 MED ORDER — LACTATED RINGERS IV SOLN
INTRAVENOUS | Status: DC | PRN
Start: 2023-12-06 — End: 2023-12-06

## 2023-12-06 MED ORDER — CHLORHEXIDINE GLUCONATE 0.12 % MT SOLN
15.0000 mL | Freq: Once | OROMUCOSAL | Status: AC
  Administered 2023-12-06: 15 mL via OROMUCOSAL

## 2023-12-06 MED ORDER — LIDOCAINE HCL (CARDIAC) PF 100 MG/5ML IV SOSY
PREFILLED_SYRINGE | INTRAVENOUS | Status: DC | PRN
  Administered 2023-12-06: 80 mg via INTRAVENOUS

## 2023-12-06 MED ORDER — SURGIFLO WITH THROMBIN (HEMOSTATIC MATRIX KIT) OPTIME
TOPICAL | Status: DC | PRN
Start: 2023-12-06 — End: 2023-12-06
  Administered 2023-12-06: 1

## 2023-12-06 MED ORDER — 0.9 % SODIUM CHLORIDE (POUR BTL) OPTIME
TOPICAL | Status: DC | PRN
  Administered 2023-12-06: 500 mL

## 2023-12-06 MED ORDER — SODIUM CHLORIDE (PF) 0.9 % IJ SOLN
INTRAMUSCULAR | Status: AC
  Filled 2023-12-06: qty 30

## 2023-12-06 MED ORDER — PROPOFOL 10 MG/ML IV BOLUS
INTRAVENOUS | Status: DC | PRN
  Administered 2023-12-06: 140 mg via INTRAVENOUS
  Administered 2023-12-06: 10 ug/kg/min via INTRAVENOUS

## 2023-12-06 MED ORDER — LACTATED RINGERS IV SOLN: INTRAVENOUS | Status: DC

## 2023-12-06 MED ORDER — KETAMINE HCL 50 MG/5ML IJ SOSY
PREFILLED_SYRINGE | INTRAMUSCULAR | Status: AC
  Filled 2023-12-06: qty 5

## 2023-12-06 MED ORDER — SENNA 8.6 MG PO TABS: 1.0000 | ORAL_TABLET | Freq: Two times a day (BID) | ORAL | 0 refills | Status: AC | PRN

## 2023-12-06 SURGICAL SUPPLY — 30 items
BASIN KIT SINGLE STR (MISCELLANEOUS) ×1 IMPLANT
BRUSH SCRUB EZ 4% CHG (MISCELLANEOUS) ×1 IMPLANT
BUR NEURO DRILL SOFT 3.0X3.8M (BURR) ×1 IMPLANT
DERMABOND ADVANCED .7 DNX12 (GAUZE/BANDAGES/DRESSINGS) ×1 IMPLANT
DRAPE C-ARM XRAY 36X54 (DRAPES) ×2 IMPLANT
DRAPE LAPAROTOMY 100X77 ABD (DRAPES) ×1 IMPLANT
DRAPE SPINE LEICA/WILD 54X150 (DRAPES) ×1 IMPLANT
DRSG OPSITE POSTOP 3X4 (GAUZE/BANDAGES/DRESSINGS) ×1 IMPLANT
DRSG TEGADERM 4X4.75 (GAUZE/BANDAGES/DRESSINGS) IMPLANT
ELECTRODE EZSTD 165MM 6.5IN (MISCELLANEOUS) ×1 IMPLANT
ELECTRODE REM PT RTRN 9FT ADLT (ELECTROSURGICAL) ×1 IMPLANT
GLOVE BIOGEL PI IND STRL 7.0 (GLOVE) ×1 IMPLANT
GLOVE BIOGEL PI IND STRL 8 (GLOVE) ×2 IMPLANT
GLOVE SURG SYN 7.0 PF PI (GLOVE) ×1 IMPLANT
GLOVE SURG SYN 7.5 PF PI (GLOVE) ×1 IMPLANT
GOWN SRG XL LVL 3 NONREINFORCE (GOWNS) ×1 IMPLANT
GOWN STRL REUS W/ TWL LRG LVL3 (GOWN DISPOSABLE) ×1 IMPLANT
KIT WILSON FRAME (KITS) ×1 IMPLANT
NDL SAFETY ECLIP 18X1.5 (MISCELLANEOUS) ×1 IMPLANT
NS IRRIG 500ML POUR BTL (IV SOLUTION) ×1 IMPLANT
PACK LAMINECTOMY ARMC (PACKS) ×1 IMPLANT
PAD ARMBOARD POSITIONER FOAM (MISCELLANEOUS) ×2 IMPLANT
SURGIFLO W/THROMBIN 8M KIT (HEMOSTASIS) ×1 IMPLANT
SUT MNCRL AB 4-0 PS2 18 (SUTURE) IMPLANT
SUT STRATA 3-0 15 PS-2 (SUTURE) ×1 IMPLANT
SUT VIC AB 0 CT1 27XCR 8 STRN (SUTURE) ×1 IMPLANT
SUT VIC AB 2-0 CT1 18 (SUTURE) ×1 IMPLANT
SYR 30ML LL (SYRINGE) ×2 IMPLANT
SYR 3ML LL SCALE MARK (SYRINGE) ×1 IMPLANT
TRAP FLUID SMOKE EVACUATOR (MISCELLANEOUS) ×1 IMPLANT

## 2023-12-06 NOTE — Op Note (Signed)
 Indications: Darlene Mcdowell is suffering from lumbar radiculopathy. The patient tried and failed conservative management, prompting surgical intervention.  Findings: Severe scarring of the nerve root, no clear separate disc fragment, nerve was well decompressed dorsally at the end of the procedure  Preoperative Diagnosis:Right-sided radiculopathy with foot drop  Postoperative Diagnosis: Right-sided radiculopathy with foot drop   EBL: Minimal IVF: see anesthesia record Drains: none Disposition: Extubated and Stable to PACU Complications: none  No foley catheter was placed.   Preoperative Note:   Risks of surgery discussed include: infection, bleeding, stroke, coma, death, paralysis, CSF leak, nerve/spinal cord injury, numbness, tingling, weakness, complex regional pain syndrome, recurrent stenosis and/or disc herniation, vascular injury, development of instability, neck/back pain, need for further surgery, persistent symptoms, development of deformity, and the risks of anesthesia. The patient understood these risks and agreed to proceed.  Operative Note:   1) right L 4/5 microdiscectomy  The patient was then brought from the preoperative center with intravenous access established.  The patient underwent general anesthesia and endotracheal tube intubation, and was then rotated on the [place] rail top where all pressure points were appropriately padded.  The skin was then thoroughly cleansed.  Perioperative antibiotic prophylaxis was administered.  Sterile prep and drapes were then applied and a timeout was then observed.  C-arm was brought into the field under sterile conditions, and the L 4-5 disc space identified and marked with an i in the midline where her previous incision was..  Once this was complete a 2 cm incision was opened with the use of a #10 blade knife.  The Metrx tubes were sequentially advanced under lateral fluoroscopy until a 18 x 40 mm Metrx tube was placed over the  facet and lamina and secured to the bed.    The microscope was then sterilely brought into the field and muscle creep was hemostased with a bipolar and resected with a pituitary rongeur.  A Bovie extender was then used to expose the spinous process and lamina.  Careful attention was placed to not violate the facet capsule. A 3 mm matchstick drill bit was then used to make a hemi-laminotomy trough until the ligamentum flavum was exposed.  This was extended to the base of the spinous process.  Once this was complete and the underlying ligamentum flavum was visualized, the ligamentum was dissected with an up angle curette and resected with a #2 and #3 mm biting Kerrison.  The laminotomy opening was also expanded in similar fashion and hemostasis was obtained with Surgifoam and a patty as well as bone wax.  The rostral aspect of the caudal level of the lamina was also resected with a #2 biting Kerrison effort to further enhance exposure.  There was severe scarring under the level of the ligamentum flavum, we are able to identify the traversing nerve root and slowly dissected off the scar tissue, the nerve appeared to be quite swollen and had small amount of disc tissue that was resected.  The scar tissue ventrally was severely adherent and due to risk of nerve injury was not dissected off completely.  It was decompressed dorsally and ventrally and there was good room for the nerve.  We then irrigated copiously obtained meticulous hemostasis and placed Depo-Medrol  on the nerve root.    The fascial layer was reapproximated with the use of a 0- Vicryl suture.  Subcutaneous tissue layer was reapproximated using 2-0 Vicryl suture.  3-0 monocryl was used on the skin. The skin was then cleansed and Dermabond  was used to close the skin opening.  Patient was then rotated back to the preoperative bed awakened from anesthesia and taken to recovery all counts are correct in this case.   I performed the entire procedure  with the assistance of  Edsel Goods, PA-C as an designer, television/film set. An assistant was required for this procedure due to the complexity.  The assistant provided assistance in tissue manipulation and suction, closure and was required for the successful and safe performance of the procedure. I performed the critical portions of the procedure.   Penne LELON Sharps, MD [facility] Neurosurgery

## 2023-12-06 NOTE — Anesthesia Procedure Notes (Signed)
 Procedure Name: Intubation Date/Time: 12/06/2023 8:49 AM  Performed by: Antonetta Ronnald Caldron, CRNAPre-anesthesia Checklist: Patient identified, Emergency Drugs available, Suction available and Patient being monitored Patient Re-evaluated:Patient Re-evaluated prior to induction Oxygen Delivery Method: Circle system utilized Preoxygenation: Pre-oxygenation with 100% oxygen Induction Type: IV induction Ventilation: Mask ventilation without difficulty Laryngoscope Size: McGrath (3) Tube type: Oral Tube size: 7.0 mm Number of attempts: 1 Airway Equipment and Method: Stylet Placement Confirmation: ETT inserted through vocal cords under direct vision, positive ETCO2 and breath sounds checked- equal and bilateral Secured at: 21 cm Tube secured with: Tape Dental Injury: Teeth and Oropharynx as per pre-operative assessment  Comments: [place] F, 1

## 2023-12-06 NOTE — Anesthesia Postprocedure Evaluation (Signed)
 Anesthesia Post Note  Patient: JAZIYAH GRADEL  Procedure(s) Performed: Right-sided L4-5 hemilaminectomy discectomy, MIS (Right: Spine Lumbar)  Patient location during evaluation: PACU Anesthesia Type: General Level of consciousness: awake and alert Pain management: pain level controlled Vital Signs Assessment: post-procedure vital signs reviewed and stable Respiratory status: spontaneous breathing, nonlabored ventilation, respiratory function stable and patient connected to nasal cannula oxygen Cardiovascular status: blood pressure returned to baseline and stable Postop Assessment: no apparent nausea or vomiting Anesthetic complications: no   No notable events documented.   Last Vitals:  Vitals:   12/06/23 1100 12/06/23 1122  BP: 139/76 136/66  Pulse: 62 65  Resp: (!) 8 16  Temp: 36.4 C 36.4 C  SpO2: 99% 98%    Last Pain:  Vitals:   12/06/23 1122  TempSrc: Temporal  PainSc: 0-No pain                 Debby Mines

## 2023-12-06 NOTE — Transfer of Care (Signed)
 Immediate Anesthesia Transfer of Care Note  Patient: Darlene Mcdowell  Procedure(s) Performed: Right-sided L4-5 hemilaminectomy discectomy, MIS (Right: Spine Lumbar)  Patient Location: PACU  Anesthesia Type:General  Level of Consciousness: drowsy and patient cooperative  Airway & Oxygen Therapy: Patient Spontanous Breathing and Patient connected to face mask oxygen  Post-op Assessment: Report given to RN and Post -op Vital signs reviewed and stable  Post vital signs: Reviewed and stable  Last Vitals:  Vitals Value Taken Time  BP 134/68 12/06/23 10:21  Temp    Pulse 78 12/06/23 10:24  Resp 14 12/06/23 10:24  SpO2 100 % 12/06/23 10:24  Vitals shown include unfiled device data.  Last Pain:  Vitals:   12/06/23 0800  TempSrc: Temporal  PainSc: 3          Complications: No notable events documented.

## 2023-12-06 NOTE — Anesthesia Preprocedure Evaluation (Signed)
 Anesthesia Evaluation  Patient identified by MRN, date of birth, ID band Patient awake    Reviewed: Allergy & Precautions, NPO status , Patient's Chart, lab work & pertinent test results, reviewed documented beta blocker date and time   History of Anesthesia Complications (+) PONV and history of anesthetic complications  Airway Mallampati: II  TM Distance: >3 FB Neck ROM: full    Dental no notable dental hx. (+) Teeth Intact   Pulmonary neg pulmonary ROS, sleep apnea , former smoker   Pulmonary exam normal breath sounds clear to auscultation       Cardiovascular hypertension, Pt. on medications negative cardio ROS Normal cardiovascular exam Rhythm:Regular Rate:Normal     Neuro/Psych  PSYCHIATRIC DISORDERS Anxiety      Neuromuscular disease negative neurological ROS  negative psych ROS   GI/Hepatic negative GI ROS, Neg liver ROS,GERD  Medicated,Patient did not received Oral Contrast Agents,  Endo/Other  negative endocrine ROSdiabetes, Well Controlled, Type 2Hypothyroidism  Hyperlipidemia  Renal/GU      Musculoskeletal   Abdominal   Peds  Hematology negative hematology ROS (+)   Anesthesia Other Findings Past Medical History: No date: Anxiety No date: Arthritis     Comment:  right shoulder No date: Cancer (HCC)     Comment:  skin No date: GERD (gastroesophageal reflux disease) No date: Hyperlipidemia No date: Hypertension No date: Hypothyroidism No date: Motion sickness No date: PONV (postoperative nausea and vomiting) No date: Pre-diabetes No date: Sciatica of right side No date: Sleep apnea No date: Spinal stenosis of lumbar region No date: Thyroid  disease     Comment:  under active thyroid  problems No date: Vitamin D  deficiency  Past Surgical History: 06/30/2021: CATARACT EXTRACTION W/PHACO; Left     Comment:  Procedure: CATARACT EXTRACTION PHACO AND INTRAOCULAR               LENS PLACEMENT (IOC)  LEFT 6.29 00:38.4;  Surgeon:               Jaye Fallow, MD;  Location: [facility];                Service: Ophthalmology;  Laterality: Left;  mid morning               or late arrival 12/01/2021: CATARACT EXTRACTION W/PHACO; Right     Comment:  Procedure: CATARACT EXTRACTION PHACO AND INTRAOCULAR               LENS PLACEMENT (IOC) RIGHT  5.25  00:34.2;  Surgeon:               Jaye Fallow, MD;  Location: [facility];                Service: Ophthalmology;  Laterality: Right; 06/15/2022: DRUG INDUCED ENDOSCOPY; Bilateral     Comment:  Procedure: DRUG INDUCED SLEEP  ENDOSCOPY;  Surgeon:               Carlie Clark, MD;  Location: [facility];               Service: ENT;  Laterality: Bilateral; 08/02/2022: IMPLANTATION OF HYPOGLOSSAL NERVE STIMULATOR; Right     Comment:  Procedure: IMPLANTATION OF HYPOGLOSSAL NERVE STIMULATOR;              Surgeon: Carlie Clark, MD;  Location: [facility];  Service: ENT;  Laterality: Right; 2021: LUMBAR LAMINECTOMY  Comment:  [employer] 01/12/2003: SPINE SURGERY     Comment:  cervical c-7 01/12/1979: TUBAL LIGATION  BMI    Body Mass Index: 25.76 kg/m      Reproductive/Obstetrics negative OB ROS                              Anesthesia Physical Anesthesia Plan  ASA: 2  Anesthesia Plan: General ETT   Post-op Pain Management:    Induction: Intravenous  PONV Risk Score and Plan: 4 or greater and Ondansetron , Dexamethasone , Propofol  infusion, TIVA and Midazolam   Airway Management Planned: Oral ETT  Additional Equipment:   Intra-op Plan:   Post-operative Plan: Extubation in OR  Informed Consent: I have reviewed the patients History and Physical, chart, labs and discussed the procedure including the risks, benefits and alternatives for the proposed anesthesia with the patient or authorized representative who has indicated his/her  understanding and acceptance.     Dental Advisory Given  Plan Discussed with: Anesthesiologist, CRNA and Surgeon  Anesthesia Plan Comments: (Patient consented for risks of anesthesia including but not limited to:  - adverse reactions to medications - damage to eyes, teeth, lips or other oral mucosa - nerve damage due to positioning  - sore throat or hoarseness - Damage to heart, brain, nerves, lungs, other parts of body or loss of life  Patient voiced understanding and assent.)        Anesthesia Quick Evaluation

## 2023-12-06 NOTE — Discharge Instructions (Signed)

## 2023-12-06 NOTE — Telephone Encounter (Signed)
 patient picked up her medication today but she wants to make sure there is no conflict with her taking xanax  as well? the tramadol  and robaxin .  Per Lyle, Robaxin  and Tramadol  are ok to take together, but needs to be careful with adding in the xanax .  Patient advised, patient was advised not to take Xanax  routinely but as needed and that the 3 medications together can cause increase in side effects such as drowsiness, confusion, dizziness.

## 2023-12-06 NOTE — Progress Notes (Signed)
 Referring Physician:  Montey Lot, PA-C [address] [place],  [place] [address]  Primary Physician:  Montey Lot, PA-C  History of Present Illness: Darlene Mcdowell has a history of HTN, OSA, hypothyroidism, GAD, hyperlipidemia.  She has never had a previous L5-S1 decompression where she had improved pain after decompression.  She was recently seen by Glade Boys and evaluated for possible foot drop with severe lower extremity radiculopathy and weakness.  She states that over the past 5 years her right sided low back pain has continued to get worse.  She feels worsening weakness in her right lower extremity.  She is getting numbness and tingling mostly on the lateral aspect of her lower extremity wrapping around to the top of the foot and lateral side of her foot.  She does not have any medial lower extremity pain numbness or tingling.  She has noticed that she has worsened weakness in her right lower extremity causing significant foot drop and she has also noticed her left leg will sometimes drag.  This tends to get worse when she is been trying to ambulate more.  She is taking neurontin. No relief with last ESI.   She does not smoke.   Bowel/Bladder Dysfunction: urinary incontinence x 1 year-sees gynecologist and is taking Gemtesa  Conservative measures:  Physical therapy:  has not participated in PT recently Multimodal medical therapy including regular antiinflammatories:  Robaxin , Meloxicam, Tylenol  Arthritis, Hyland's cramp medication, Gabapentin, Tizanidine, voltaren gel, Lidocaine  patches Injections:  05/27/2023 right L5-S1 TF and right S1 TF ESI   Past Surgery:  history of cervical spine surgery in the early 2002, where a plate and screws were placed at C6 or C7  Lumbar Laminectomy L4 and L5-2021  The symptoms are causing a significant impact on the patient's life.   Review of Systems:  A 10 point review of systems is negative, except for the pertinent positives and  negatives detailed in the HPI.  Past Medical History: Past Medical History:  Diagnosis Date   Anxiety    Arthritis    right shoulder   Cancer (HCC)    skin   GERD (gastroesophageal reflux disease)    Hyperlipidemia    Hypertension    Hypothyroidism    Motion sickness    PONV (postoperative nausea and vomiting)    Pre-diabetes    Sciatica of right side    Sleep apnea    Spinal stenosis of lumbar region    Thyroid  disease    under active thyroid  problems   Vitamin D  deficiency     Past Surgical History: Past Surgical History:  Procedure Laterality Date   CATARACT EXTRACTION W/PHACO Left 06/30/2021   Procedure: CATARACT EXTRACTION PHACO AND INTRAOCULAR LENS PLACEMENT (IOC) LEFT 6.29 00:38.4;  Surgeon: Jaye Fallow, MD;  Location: [facility];  Service: Ophthalmology;  Laterality: Left;  mid morning or late arrival   CATARACT EXTRACTION W/PHACO Right 12/01/2021   Procedure: CATARACT EXTRACTION PHACO AND INTRAOCULAR LENS PLACEMENT (IOC) RIGHT  5.25  00:34.2;  Surgeon: Jaye Fallow, MD;  Location: [facility];  Service: Ophthalmology;  Laterality: Right;   DRUG INDUCED ENDOSCOPY Bilateral 06/15/2022   Procedure: DRUG INDUCED SLEEP  ENDOSCOPY;  Surgeon: Carlie Clark, MD;  Location: [facility];  Service: ENT;  Laterality: Bilateral;   IMPLANTATION OF HYPOGLOSSAL NERVE STIMULATOR Right 08/02/2022   Procedure: IMPLANTATION OF HYPOGLOSSAL NERVE STIMULATOR;  Surgeon: Carlie Clark, MD;  Location:  SURGERY CENTER;  Service: ENT;  Laterality: Right;  LUMBAR LAMINECTOMY  2021   [facility]   SPINE SURGERY  01/12/2003   cervical c-7   TUBAL LIGATION  01/12/1979    Allergies: Allergies as of 08/09/2023 - Review Complete 07/26/2023  Allergen Reaction Noted   Hydrocodone Nausea And Vomiting 11/03/2021   Oxycodone  Nausea And Vomiting 06/23/2021   Pantoprazole  11/25/2021   Tizanidine  11/25/2021   Wellbutrin [bupropion]  Swelling 06/23/2021    Medications: Outpatient Encounter Medications as of 08/09/2023  Medication Sig   ACCU-CHEK GUIDE test strip USE TO TEST 1 (ONE) EACH FOR ONCE DAILY GLUCOSE MONITORING   Accu-Chek Softclix Lancets lancets SMARTSIG:1 Each Topical As Directed   ALPRAZolam  (XANAX ) 0.25 MG tablet Take 1 tablet by mouth once daily as needed for panic attacks (Patient taking differently: Take 0.25 mg by mouth 2 (two) times daily.)   aspirin 81 MG chewable tablet Chew 81 mg by mouth daily.   Coenzyme Q10 (COQ10) 200 MG CAPS Take 200 mg by mouth daily.   diclofenac Sodium (VOLTAREN) 1 % GEL Apply 1 Application topically 4 (four) times daily as needed (pain).   gabapentin (NEURONTIN) 100 MG capsule Take 100 mg by mouth 3 (three) times daily.   Homeopathic Products (LEG CRAMP RELIEF PO) Take 2 tablets by mouth 2 (two) times daily as needed (leg cramp).   Ibuprofen-diphenhydrAMINE  Cit (ADVIL PM PO) Take 2 tablets by mouth at bedtime as needed.   levothyroxine  (SYNTHROID ) 75 MCG tablet Take 75 mcg by mouth daily.   omeprazole  (PRILOSEC) 40 MG capsule Take 40 mg by mouth daily.   rosuvastatin  (CRESTOR ) 10 MG tablet Take 10 mg by mouth daily.   valsartan (DIOVAN) 320 MG tablet Take 320 mg by mouth daily.   No facility-administered encounter medications on file as of 08/09/2023.    Social History: Social History   Tobacco Use   Smoking status: Former    Current packs/day: 0.00    Average packs/day: 1 pack/day for 15.0 years (15.0 ttl pk-yrs)    Types: Cigarettes    Start date: 01/12/1988    Quit date: 01/12/2003    Years since quitting: 20.5   Smokeless tobacco: Never  Vaping Use   Vaping status: Never Used  Substance Use Topics   Alcohol use: Not Currently   Drug use: No    Family Medical History: Family History  Problem Relation Age of Onset   Stroke Mother    Hypertension Mother    COPD Mother    Hyperlipidemia Father    Heart disease Father    Hypertension Father    Cancer  Maternal Grandmother    Heart disease Maternal Grandfather    Heart disease Paternal Grandmother    Birth defects Daughter    Diabetes Daughter    Hearing loss Daughter    Learning disabilities Daughter    Vision loss Daughter    Breast cancer Neg Hx     Physical Examination: There were no vitals filed for this visit.    Awake, alert, oriented to person, place, and time.  Speech is clear and fluent. Fund of knowledge is appropriate.   Cranial Nerves: Pupils equal round and reactive to light.  Facial tone is symmetric.    No gross lumbar tenderness.   No abnormal lesions on exposed skin.   Strength: Side Iliopsoas Quads Hamstring PF DF EHL  R 5 5 5  4+ 3 3  L 5 5 5 5 5 5    Reflexes are 2+ and symmetric at the biceps, brachioradialis, patella  and achilles, but depressed at right achilles.  Medial hamstring reflex on the right is also decreased on compared to the left.  Hoffman's is absent.  Clonus is not present.   Bilateral upper and lower extremity sensation is intact to light touch, with the exception of decreased sensation in the right L5 distribution  Medical Decision Making  Imaging: Lumbar MRI dated 08/01/23:  FINDINGS: Segmentation:  Standard.   Alignment: Lumbar lordosis is maintained. No significant listhesis. Mild S-shaped scoliotic curvature of the lumbar spine with rightward convex curvature centered around L2 and leftward convex curvature centered on L4.   Vertebrae: Vertebral body heights are maintained. No bone marrow edema or evidence of acute fracture. Modic type 2 degenerative endplate changes and mild endplate irregularity at L4-5. Heterogeneous bone marrow signal intensity without suspicious osseous lesion.   Conus medullaris and cauda equina: Conus extends to the L1 level. Conus and cauda equina appear normal.   Paraspinal and other soft tissues: Postsurgical changes in the paraspinal soft tissues at L4-5. The paraspinal soft tissues  are otherwise unremarkable.   Disc levels:   T12-L1: Small disc bulge. Mild facet arthrosis. No significant spinal canal or foraminal stenosis.   L1-2: Small disc bulge. Mild facet arthrosis. No significant spinal canal or foraminal stenosis.   L2-3: Small disc bulge. Mild facet arthrosis. No significant spinal canal or foraminal stenosis.   L3-4: Diffuse disc bulge resulting in lateral recess narrowing greater on the right. Moderate facet arthrosis and thickening of the ligamentum flavum. No significant spinal canal stenosis. Possible impingement upon the traversing right L4 nerve root. There is moderate foraminal stenosis on the right.   L4-5: Disc desiccation and moderate disc height loss. Diffuse disc bulge and posterior osteophytes. Small left foraminal disc protrusion. Moderate facet arthrosis. Postsurgical changes of right hemilaminotomy. Mild spinal canal stenosis. There is moderate right and mild left foraminal stenosis. Extraforaminal component of disc bulge possibly contacts the right L4 nerve root.   L5-S1: Mild disc height loss. Diffuse disc bulge with mild lateral recess narrowing. Moderate facet arthrosis. No significant spinal canal stenosis. There is mild bilateral foraminal stenosis.   IMPRESSION: Degenerative changes as above. No high-grade spinal canal stenosis. Disc bulge at L3-4 results in lateral recess narrowing with possible impingement upon the traversing right L4 nerve root.   Extraforaminal component of disc bulge at L4-5 possibly contacts the right L4 nerve root.   Multilevel foraminal stenosis, greatest and moderate on the right at L3-4 and L4-5.     Electronically Signed   By: Donnice Mania M.D.   On: 08/05/2023 21:49   I have personally reviewed the images and agree with the above interpretation.   Assessment and Plan: Darlene Mcdowell has a history of lumbar surgery in 2021.  She had a initially had a good result unfortunately she  continues to have worsened right lower extremity pain weakness numbness and tingling.  She feels like this is getting progressively worse.  She is having difficulty sleeping.  She feels like she has had progressive weakness as well.  When compared to her last evaluation she has significant foot drop with EHL weakness and also with weakness in inversion and eversion consistent with a lumbar radiculopathy.  Her MRI demonstrates L3-4 and L4-5 disc herniations with moderate stenosis noted.  She does not appear to be symptomatic from the L3-4 level but she does appear to be symptomatic from the L4-5 level.  She has what appears to be an extruded disc fragment versus a  large swollen intra dural nerve root, I think this is more likely to be a disc herniation this been slightly extruded causing her recurrent/worsening pain.She has positive straight leg raise, decreased L5 reflex, weak dorsiflexion EHL inversion and eversion.  She also has decreased sensation in the L5 distribution and straight leg raise reproduces her right sided L5 symptoms.    Given her progressive weakness numbness tingling refractory pain and it has impact on her quality of life and ability to perform her ADLs would plan for a right sided L4-5 hemilaminectomy discectomy.  We discussed that she may not have a complete resolution of her pain, CSF leak, nerve injury, spine injury, need for further surgery, further decompression, possible fusion.  Penne MICAEL Sharps, MD Dept. of Neurosurgery

## 2023-12-07 ENCOUNTER — Encounter: Payer: Self-pay | Admitting: Neurosurgery

## 2023-12-11 NOTE — H&P (Signed)
 Documented as progress note from same day

## 2023-12-16 NOTE — Progress Notes (Unsigned)
   REFERRING PHYSICIAN:  Montey Lot, Pa-c [address] [place],  [place] [address]  DOS: 12/06/23  Right L4-L5 microdiscectomy  HISTORY OF PRESENT ILLNESS: Darlene Mcdowell is approximately 2 weeks status post above surgery. Was given robaxin  and ultram  on discharge from the hospital.   She had preop right leg pain and foot drop. Her right leg pain is better since surgery- feels like it is improving everyday. She feels like her right foot is getting stronger. She has mild soreness in her lower back that is worse at the end of the day.   She is taking prn robaxin  and tylenol . She is not taking ultram .   PHYSICAL EXAMINATION:  General: Patient is well developed, well nourished, calm, collected, and in no apparent distress.   NEUROLOGICAL:  General: In no acute distress.   Awake, alert, oriented to person, place, and time.  Pupils equal round and reactive to light.  Facial tone is symmetric.     Strength:            Side Iliopsoas Quads Hamstring PF DF EHL  R 5 5 5 5 3 3   L 5 5 5 5 5 5    Incision c/d/i   ROS (Neurologic):  Negative except as noted above  IMAGING: Nothing new to review.   ASSESSMENT/PLAN:  Darlene Mcdowell is doing well s/p above surgery. Treatment options reviewed with patient and following plan made:   - I have advised the patient to lift up to 10 pounds until 6 weeks after surgery (follow up with Dr. Claudene).  - Reviewed wound care.  - No bending, twisting, or lifting.  - Continue on current medications including prn robaxin  and prn OTC tylenol . - She has AFO. Will wear when up and walking.   - Follow up as scheduled in 4 weeks and prn.   Advised to contact the office if any questions or concerns arise.  Glade Boys PA-C Department of neurosurgery

## 2023-12-19 ENCOUNTER — Encounter: Admitting: Orthopedic Surgery

## 2023-12-20 ENCOUNTER — Encounter: Payer: Self-pay | Admitting: Orthopedic Surgery

## 2023-12-20 ENCOUNTER — Ambulatory Visit: Admitting: Orthopedic Surgery

## 2023-12-20 VITALS — BP 120/78 | Temp 97.7°F | Ht 67.0 in | Wt 164.0 lb

## 2023-12-20 DIAGNOSIS — Z9889 Other specified postprocedural states: Secondary | ICD-10-CM

## 2023-12-20 DIAGNOSIS — R29898 Other symptoms and signs involving the musculoskeletal system: Secondary | ICD-10-CM

## 2023-12-27 ENCOUNTER — Encounter: Payer: Self-pay | Admitting: Sleep Medicine

## 2023-12-29 ENCOUNTER — Ambulatory Visit: Admitting: Sleep Medicine

## 2023-12-29 ENCOUNTER — Encounter: Payer: Self-pay | Admitting: Sleep Medicine

## 2023-12-29 VITALS — BP 120/78 | HR 69 | Temp 97.8°F | Ht 67.0 in | Wt 165.4 lb

## 2023-12-29 DIAGNOSIS — Z9682 Presence of neurostimulator: Secondary | ICD-10-CM | POA: Diagnosis not present

## 2023-12-29 DIAGNOSIS — I10 Essential (primary) hypertension: Secondary | ICD-10-CM

## 2023-12-29 DIAGNOSIS — Z87891 Personal history of nicotine dependence: Secondary | ICD-10-CM | POA: Diagnosis not present

## 2023-12-29 DIAGNOSIS — G4733 Obstructive sleep apnea (adult) (pediatric): Secondary | ICD-10-CM

## 2023-12-29 NOTE — Progress Notes (Signed)
 Name:Darlene Mcdowell MRN: [id number] DOB: 14-Mar-1950   CHIEF COMPLAINT:  INSPIRE F/U   HISTORY OF PRESENT ILLNESS: Darlene Mcdowell is a 73 y.o. w/ a h/o OSA on Inspire therapy, HTN, hypothyroidism and GERD who presents for Inspire F/U visit. Reports R sided tongue and gum discomfort with Inspire therapy over the last few months. States that she is unable to tolerate current setting which is 0.9 V.    PAST MEDICAL HISTORY :   has a past medical history of Anxiety, Arthritis, Cancer (HCC), Contusion of right hip region (03/09/2021), Elevated ALT measurement (07/03/2016), GERD (gastroesophageal reflux disease), Hyperlipidemia, Hypertension, Hypothyroidism, Motion sickness, PONV (postoperative nausea and vomiting), Pre-diabetes, Sciatica of right side, Sleep apnea, Spinal stenosis of lumbar region, Thyroid  disease, and Vitamin D  deficiency.  has a past surgical history that includes Tubal ligation (01/12/1979); Spine surgery (01/12/2003); Lumbar laminectomy (2021); Cataract extraction w/PHACO (Left, 06/30/2021); Cataract extraction w/PHACO (Right, 12/01/2021); Drug induced endoscopy (Bilateral, 06/15/2022); Implantation of hypoglossal nerve stimulator (Right, 08/02/2022); and Lumbar laminectomy/decompression microdiscectomy (Right, 12/06/2023). Prior to Admission medications  Medication Sig Start Date End Date Taking? Authorizing Provider  ACCU-CHEK GUIDE test strip USE TO TEST 1 (ONE) EACH FOR ONCE DAILY GLUCOSE MONITORING 10/01/21  Yes [provider]  Accu-Chek Softclix Lancets lancets SMARTSIG:1 Each Topical As Directed 10/02/21  Yes [provider]  acetaminophen  (TYLENOL ) 650 MG CR tablet Take 1,300 mg by mouth in the morning, at noon, and at bedtime.   Yes [provider]  ALPRAZolam  (XANAX ) 0.25 MG tablet Take 1 tablet by mouth once daily as needed for panic attacks Patient taking differently: Take 0.25 mg by mouth 2 (two) times daily. 12/19/15  Yes Clark, Katherine  K, NP  aspirin EC 81 MG tablet Take 81 mg by mouth daily. Swallow whole.   Yes [provider]  clobetasol cream (TEMOVATE) 0.05 % Apply 1 Application topically 2 (two) times a week.   Yes [provider]  Coenzyme Q10 (COQ10) 200 MG CAPS Take 200 mg by mouth in the morning.   Yes [provider]  diclofenac Sodium (VOLTAREN) 1 % GEL Apply 1 Application topically 4 (four) times daily as needed (right shoulder pain).   Yes [provider]  estradiol (ESTRACE) 0.1 MG/GM vaginal cream Place 1 Applicatorful vaginally 2 (two) times a week. Mondays & Wednesdays   Yes [provider]  fluticasone (FLONASE) 50 MCG/ACT nasal spray Place 2 sprays into both nostrils daily. 12/26/23  Yes [provider]  GEMTESA 75 MG TABS Take 75 mg by mouth in the morning. 07/30/23  Yes [provider]  Homeopathic Products (LEG CRAMP RELIEF PO) Take 2 tablets by mouth 2 (two) times daily as needed (leg cramp).   Yes [provider]  levothyroxine  (SYNTHROID ) 75 MCG tablet Take 75 mcg by mouth daily before breakfast. 01/21/23  Yes [provider]  methocarbamol  (ROBAXIN ) 500 MG tablet Take 1 tablet (500 mg total) by mouth every 6 (six) hours as needed for muscle spasms. 12/06/23  Yes Gregory Edsel Ruth, PA  omeprazole  (PRILOSEC) 40 MG capsule Take 40 mg by mouth daily before breakfast.   Yes [provider]  polyethylene glycol (MIRALAX ) 17 g packet Take 17 g by mouth daily as needed. 12/06/23  Yes Gregory Edsel Ruth, PA  rosuvastatin  (CRESTOR ) 10 MG tablet Take 10 mg by mouth at bedtime.   Yes [provider]  senna (SENOKOT) 8.6 MG TABS tablet Take 1 tablet (8.6 mg total)  by mouth 2 (two) times daily as needed. 12/06/23  Yes Gregory Edsel Ruth, PA  valsartan (DIOVAN) 320 MG tablet Take 320 mg by mouth in the morning.   Yes [provider]   Allergies[1]  FAMILY HISTORY:  family history includes Birth defects in her  daughter; COPD in her mother; Cancer in her maternal grandmother; Diabetes in her daughter; Hearing loss in her daughter; Heart disease in her father, maternal grandfather, and paternal grandmother; Hyperlipidemia in her father; Hypertension in her father and mother; Learning disabilities in her daughter; Stroke in her mother; Vision loss in her daughter. SOCIAL HISTORY:  reports that she quit smoking about 20 years ago. Her smoking use included cigarettes. She started smoking about 35 years ago. She has a 15 pack-year smoking history. She has never used smokeless tobacco. She reports that she does not currently use alcohol. She reports that she does not use drugs.   Review of Systems:  Gen:  Denies  fever, sweats, chills weight loss  HEENT: Denies blurred vision, double vision, ear pain, eye pain, hearing loss, nose bleeds, sore throat Cardiac:  No dizziness, chest pain or heaviness, chest tightness,edema, No JVD Resp:   No cough, -sputum production, -shortness of breath,-wheezing, -hemoptysis,  Gi: Denies swallowing difficulty, stomach pain, nausea or vomiting, diarrhea, constipation, bowel incontinence Gu:  Denies bladder incontinence, burning urine Ext:   Denies Joint pain, stiffness or swelling Skin: Denies  skin rash, easy bruising or bleeding or hives Endoc:  Denies polyuria, polydipsia , polyphagia or weight change Psych:   Denies depression, insomnia or hallucinations  Other:  All other systems negative  VITAL SIGNS: BP 120/78   Pulse 69   Temp 97.8 F (36.6 C)   Ht 5' 7 (1.702 m)   Wt 165 lb 6.4 oz (75 kg)   LMP 01/11/1994   SpO2 95%   BMI 25.91 kg/m    Physical Examination:   General Appearance: No distress  EYES PERRLA, EOM intact.   NECK Supple, No JVD Pulmonary: normal breath sounds, No wheezing.  CardiovascularNormal S1,S2.  No m/r/g.   Abdomen: Benign, Soft, non-tender. Skin:   warm, no rashes, no ecchymosis  Extremities: normal, no cyanosis,  clubbing. Neuro:without focal findings,  speech normal  PSYCHIATRIC: Mood, affect within normal limits.   ASSESSMENT AND PLAN  OSA Inspire device interrogated in office today. Due to discomfort, adjusted pulse width rate to 60/40 and changed amplitude range to 0.5-1.1 V. Discussed the consequences of untreated sleep apnea. Advised not to drive drowsy for safety of patient and others. Will follow up in 8 weeks.    HTN Stable, on current management. Following with PCP.    Patient  satisfied with Plan of action and management. All questions answered  I spent a total of 47 minutes reviewing chart data, face-to-face evaluation with the patient, counseling and coordination of care as detailed above.    Miner Koral, M.D.  Sleep Medicine [facility]           [1]  Allergies Allergen Reactions   Hydrocodone Nausea And Vomiting   Motrin [Ibuprofen] Other (See Comments)    Aggravated her Lichen sclerosus   Oxycodone  Nausea And Vomiting   Pantoprazole     GI issues   Tizanidine     GI issues   Wellbutrin [Bupropion] Swelling    Ear swelling

## 2024-01-16 ENCOUNTER — Ambulatory Visit (INDEPENDENT_AMBULATORY_CARE_PROVIDER_SITE_OTHER): Admitting: Neurosurgery

## 2024-01-16 ENCOUNTER — Encounter: Payer: Self-pay | Admitting: Neurosurgery

## 2024-01-16 VITALS — BP 122/78 | Temp 97.6°F | Ht 67.0 in | Wt 165.0 lb

## 2024-01-16 DIAGNOSIS — M5416 Radiculopathy, lumbar region: Secondary | ICD-10-CM

## 2024-01-16 DIAGNOSIS — Z9889 Other specified postprocedural states: Secondary | ICD-10-CM | POA: Insufficient documentation

## 2024-01-16 NOTE — Progress Notes (Signed)
 "  REFERRING PHYSICIAN:  Montey Lot, Pa-c [address] [place],  [place] [address]  DOS: 12/06/23  Right L4-L5 microdiscectomy  Discussed the use of AI scribe software for clinical note transcription with the patient, who gave verbal consent to proceed. History of Present Illness Darlene Mcdowell is a 74 year old female, six weeks post-lumbar microdiscectomy, who presents for postoperative evaluation of persistent lumbar radiculopathy and muscle spasm.  She describes intermittent lumbar pain at the surgical site that can be severe enough to disrupt sleep, with episodes triggered by movement during sleep and relieved by half a tablet of methocarbamol , acetaminophen , and heat. She avoids twisting and uses pillows to position herself during sleep.  She continues to have aching and cramping in the plantar aspect of her foot and increased aching at the surgical site. Nocturnal muscle spasms and cramping are controlled with a reduced dose of methocarbamol , and she prefers non-opioid analgesics due to tramadol  intolerance.  She has not started physical therapy because prior PT worsened her symptoms, and she expects intermittent fluctuations as nerve recovery progresses.  Her postoperative course is also affected by difficulty maintaining hydration, which she relates to improved blood glucose control in the setting of prediabetes.  She has prior lumbar interventions, including a 2021 epidural for a synovial cyst that resulted in transient inability to ambulate requiring a wheelchair. She also had persistent mouth pain after a more recent epidural, with no acute dental pathology identified, and ongoing malocclusion and unilateral chewing difficulty after crown placement.    PHYSICAL EXAMINATION:  General: Patient is well developed, well nourished, calm, collected, and in no apparent distress.   NEUROLOGICAL:  General: In no acute distress.   Awake, alert, oriented to person, place, and  time.  Pupils equal round and reactive to light.  Facial tone is symmetric.     Strength:            Side Iliopsoas Quads Hamstring PF DF EHL  R 5 5 5 5 3 3   L 5 5 5 5 5 5    Incision c/d/i   ROS (Neurologic):  Negative except as noted above  IMAGING: Nothing new to review.   Assessment and Plan Assessment & Plan Lumbar radiculopathy following lumbar microdiscectomy Six weeks post-lumbar microdiscectomy, she reports intermittent pain and cramping, particularly nocturnally, with overall improvement. Persistent aching at the surgical site and cramping in the plantar foot are consistent with expected nerve recovery and regeneration at this stage. Symptoms may fluctuate and persist for up to a year. Delaying physical therapy is appropriate given her current recovery and preference. - Advised that intermittent pain and cramping are expected during nerve recovery and may persist for up to a year postoperatively. - Recommended maintaining adequate hydration to support nerve and muscle recovery. - Discussed that physical therapy can be initiated at the next follow-up if she feels ready; advised her to request a PT referral at that time. - Provided reassurance regarding the normal course of recovery and advised monitoring for persistent or worsening symptoms.  Postoperative muscle spasm and cramping after spine surgery She continues to experience muscle spasms and cramping, predominantly nocturnally, which are well controlled with methocarbamol  and non-pharmacologic measures. These symptoms are common during nerve healing after spine surgery. Methocarbamol  is effective and well tolerated; tramadol  is not tolerated. - Advised continued use of methocarbamol  as needed for muscle spasms and cramping. - Instructed her to contact the office for a refill if needed. - Encouraged use of non-pharmacologic measures such  as heat and comfortable positioning to alleviate symptoms. - Reinforced the importance  of hydration to reduce muscle irritability and cramping.  Penne LELON Sharps MD Department of neurosurgery "

## 2024-02-13 NOTE — Progress Notes (Unsigned)
" ° °  REFERRING PHYSICIAN:  Montey Lot, Pa-c [address] [place],  [place] [address]  DOS: 12/06/23  Right L4-L5 microdiscectomy  HISTORY OF PRESENT ILLNESS:  She continued with intermittent LBP at her last visit along with pain in right foot.   She was to continue on prn robaxin .    Start PT for lumbar spine today?***    She had preop right leg pain and foot drop.     Her right leg pain is better since surgery- feels like it is improving everyday. She feels like her right foot is getting stronger. She has mild soreness in her lower back that is worse at the end of the day.   She is taking prn robaxin  and tylenol . She is not taking ultram .   PHYSICAL EXAMINATION:  General: Patient is well developed, well nourished, calm, collected, and in no apparent distress.   NEUROLOGICAL:  General: In no acute distress.   Awake, alert, oriented to person, place, and time.  Pupils equal round and reactive to light.  Facial tone is symmetric.     Strength:            Side Iliopsoas Quads Hamstring PF DF EHL  R 5 5 5 5  3*** 3***  L 5 5 5 5 5 5    Incision well healed   ROS (Neurologic):  Negative except as noted above  IMAGING: Nothing new to review.   ASSESSMENT/PLAN:  Darlene Mcdowell is doing well s/p above surgery. Treatment options reviewed with patient and following plan made:   - She may return to activity as tolerated.  - She has AFO. Will wear when up and walking.   - Follow up with Dr. Claudene prn.***  Advised to contact the office if any questions or concerns arise.  Glade Boys PA-C Department of neurosurgery "

## 2024-02-16 ENCOUNTER — Telehealth: Payer: Self-pay | Admitting: Sleep Medicine

## 2024-02-20 ENCOUNTER — Encounter: Admitting: Orthopedic Surgery

## 2024-02-20 DIAGNOSIS — Z9889 Other specified postprocedural states: Secondary | ICD-10-CM

## 2024-02-20 DIAGNOSIS — M5416 Radiculopathy, lumbar region: Secondary | ICD-10-CM

## 2024-02-28 ENCOUNTER — Encounter: Admitting: Sleep Medicine

## 2024-02-28 ENCOUNTER — Ambulatory Visit: Admitting: Adult Health

## 2024-02-28 ENCOUNTER — Encounter: Admitting: Pulmonary Disease
